# Patient Record
Sex: Male | Born: 1965 | Race: White | Hispanic: No | Marital: Married | Smoking: Never smoker
Health system: Southern US, Community
[De-identification: ages and names within clinical notes are randomized; demographics above are authoritative.]

## PROBLEM LIST (undated history)

## (undated) DIAGNOSIS — E7800 Pure hypercholesterolemia, unspecified: Secondary | ICD-10-CM

## (undated) DIAGNOSIS — M2550 Pain in unspecified joint: Secondary | ICD-10-CM

## (undated) DIAGNOSIS — R0602 Shortness of breath: Secondary | ICD-10-CM

## (undated) DIAGNOSIS — M549 Dorsalgia, unspecified: Secondary | ICD-10-CM

## (undated) DIAGNOSIS — G4730 Sleep apnea, unspecified: Secondary | ICD-10-CM

## (undated) DIAGNOSIS — J45909 Unspecified asthma, uncomplicated: Secondary | ICD-10-CM

## (undated) DIAGNOSIS — R600 Localized edema: Secondary | ICD-10-CM

## (undated) DIAGNOSIS — M25569 Pain in unspecified knee: Secondary | ICD-10-CM

## (undated) DIAGNOSIS — E669 Obesity, unspecified: Secondary | ICD-10-CM

## (undated) DIAGNOSIS — I10 Essential (primary) hypertension: Secondary | ICD-10-CM

## (undated) HISTORY — DX: Pain in unspecified joint: M25.50

## (undated) HISTORY — PX: TONSILLECTOMY: SUR1361

## (undated) HISTORY — DX: Obesity, unspecified: E66.9

## (undated) HISTORY — PX: WISDOM TOOTH EXTRACTION: SHX21

## (undated) HISTORY — PX: BILATERAL CARPAL TUNNEL RELEASE: SHX6508

## (undated) HISTORY — DX: Dorsalgia, unspecified: M54.9

## (undated) HISTORY — DX: Pure hypercholesterolemia, unspecified: E78.00

## (undated) HISTORY — DX: Shortness of breath: R06.02

## (undated) HISTORY — DX: Unspecified asthma, uncomplicated: J45.909

## (undated) HISTORY — DX: Localized edema: R60.0

## (undated) HISTORY — DX: Essential (primary) hypertension: I10

## (undated) HISTORY — DX: Sleep apnea, unspecified: G47.30

## (undated) HISTORY — DX: Pain in unspecified knee: M25.569

---

## 2009-06-23 ENCOUNTER — Encounter: Admission: RE | Admit: 2009-06-23 | Discharge: 2009-06-23 | Payer: Self-pay | Admitting: Orthopedic Surgery

## 2015-09-23 DIAGNOSIS — M47812 Spondylosis without myelopathy or radiculopathy, cervical region: Secondary | ICD-10-CM | POA: Insufficient documentation

## 2019-09-04 ENCOUNTER — Ambulatory Visit: Payer: Self-pay | Admitting: Orthopedic Surgery

## 2020-04-27 ENCOUNTER — Other Ambulatory Visit: Payer: Self-pay

## 2020-04-27 ENCOUNTER — Ambulatory Visit (INDEPENDENT_AMBULATORY_CARE_PROVIDER_SITE_OTHER): Payer: No Typology Code available for payment source | Admitting: Bariatrics

## 2020-04-27 ENCOUNTER — Encounter (INDEPENDENT_AMBULATORY_CARE_PROVIDER_SITE_OTHER): Payer: Self-pay | Admitting: Bariatrics

## 2020-04-27 VITALS — BP 136/92 | HR 82 | Temp 98.5°F | Ht 73.0 in | Wt 360.0 lb

## 2020-04-27 DIAGNOSIS — I10 Essential (primary) hypertension: Secondary | ICD-10-CM | POA: Insufficient documentation

## 2020-04-27 DIAGNOSIS — Z9189 Other specified personal risk factors, not elsewhere classified: Secondary | ICD-10-CM | POA: Diagnosis not present

## 2020-04-27 DIAGNOSIS — Z1331 Encounter for screening for depression: Secondary | ICD-10-CM

## 2020-04-27 DIAGNOSIS — M25569 Pain in unspecified knee: Secondary | ICD-10-CM

## 2020-04-27 DIAGNOSIS — G4733 Obstructive sleep apnea (adult) (pediatric): Secondary | ICD-10-CM

## 2020-04-27 DIAGNOSIS — R0602 Shortness of breath: Secondary | ICD-10-CM | POA: Diagnosis not present

## 2020-04-27 DIAGNOSIS — R5383 Other fatigue: Secondary | ICD-10-CM | POA: Diagnosis not present

## 2020-04-27 DIAGNOSIS — G4730 Sleep apnea, unspecified: Secondary | ICD-10-CM | POA: Insufficient documentation

## 2020-04-27 DIAGNOSIS — G4739 Other sleep apnea: Secondary | ICD-10-CM | POA: Diagnosis not present

## 2020-04-27 DIAGNOSIS — Z0289 Encounter for other administrative examinations: Secondary | ICD-10-CM

## 2020-04-27 DIAGNOSIS — Z6841 Body Mass Index (BMI) 40.0 and over, adult: Secondary | ICD-10-CM

## 2020-04-27 NOTE — Progress Notes (Signed)
Dear Peri Maris, FNP,   Thank you for referring Joe Huffman to our clinic. The following note includes my evaluation and treatment recommendations.  Chief Complaint:   OBESITY Joe Huffman (MR# [id number]) is a 54 y.o. male who presents for evaluation and treatment of obesity and related comorbidities. Current BMI is Body mass index is 47.5 kg/m.Marland Kitchen Joe Huffman has been struggling with his weight for many years and has been unsuccessful in either losing weight, maintaining weight loss, or reaching his healthy weight goal.  Joe Huffman is currently in the action stage of change and ready to dedicate time achieving and maintaining a healthier weight. Joe Huffman is interested in becoming our patient and working on intensive lifestyle modifications including (but not limited to) diet and exercise for weight loss.  Joe Huffman does like to cook, but notes difficulties finding meals that they enjoy. He craves carbohydrates and sauces. He states that he is a picky eater.  Joe Huffman's habits were reviewed today and are as follows: His family eats meals together, he thinks his family will eat healthier with him, his desired weight loss is 135 lbs, he has been heavy most of his life, he started gaining weight in 2000, his heaviest weight ever was 365 pounds, he is a picky eater and doesn't like to eat healthier foods, he craves self-baked bread, gyros, french fries, filet steaks, and sauces with mushrooms, he snacks frequently in the evenings, he skips lunch sometimes, he is frequently drinking liquids with calories, he frequently makes poor food choices and he struggles with emotional eating.  Depression Screen Joe Huffman's Food and Mood (modified PHQ-9) score was 9.  Depression screen PHQ 2/9 04/27/2020  Decreased Interest 1  Down, Depressed, Hopeless 1  PHQ - 2 Score 2  Altered sleeping 3  Tired, decreased energy 2  Change in appetite 1  Feeling bad or failure about yourself  0  Trouble  concentrating 0  Moving slowly or fidgety/restless 1  Suicidal thoughts 0  PHQ-9 Score 9  Difficult doing work/chores Not difficult at all   Subjective:   Other fatigue. Joe Huffman denies daytime somnolence and admits to waking up still tired. Joe Huffman generally gets 5 hours of sleep per night, and states that he does not sleep well most nights. Snoring is present. Apneic episodes are not present. Epworth Sleepiness Score is 4.  SOB (shortness of breath). Joe Huffman notes increasing shortness of breath with certain activities and seems to be worsening over time with weight gain. He notes getting out of breath sooner with activity than he used to. This has gotten worse recently. Joe Huffman denies shortness of breath at rest or orthopnea.  Knee pain, unspecified chronicity, unspecified laterality. Joe Huffman notes intermittent knee pain.  Other sleep apnea. Joe Huffman wears CPAP, but notes difficulty with his fit.  Essential hypertension. Joe Huffman is taking Bystolic.  BP Readings from Last 3 Encounters:  04/27/20 (!) 136/92   No results found for: CREATININE  Depression screening. Joe Huffman has a mildly positive depression screen with a PHQ-9 score of 9.  At risk for activity intolerance. Joe Huffman is at risk of exercise intolerance due to knee pain.  Assessment/Plan:   Other fatigue. Joe Huffman does feel that his weight is causing his energy to be lower than it should be. Fatigue may be related to obesity, depression or many other causes. Labs will be ordered, and in the meanwhile, Joe Huffman will focus on self care including making healthy food choices, increasing physical activity and focusing on stress reduction. EKG 12-Lead, Hemoglobin A1c, Insulin, random,  VITAMIN D 25 Hydroxy (Vit-D Deficiency, Fractures), T4, free, T3, TSH testing ordered today.  SOB (shortness of breath). [name] does feel that he gets out of breath more easily that he used to when he exercises. Joe Huffman's  shortness of breath appears to be obesity related and exercise induced. He has agreed to work on weight loss and gradually increase exercise to treat his exercise induced shortness of breath. Will continue to monitor closely. Lipid Panel With LDL/HDL Ratio ordered today.  Knee pain, unspecified chronicity, unspecified laterality. Joe Huffman will work on weight loss and will gradually increase his exercise.  Other sleep apnea. Intensive lifestyle modifications are the first line treatment for this issue. We discussed several lifestyle modifications today and he will continue to work on diet, exercise and weight loss efforts. We will continue to monitor. Orders and follow up as documented in patient record. Joe Huffman will continue CPAP use as directed.  Counseling  Sleep apnea is a condition in which breathing pauses or becomes shallow during sleep. This happens over and over during the night. This disrupts your sleep and keeps your body from getting the rest that it needs, which can cause tiredness and lack of energy (fatigue) during the day.  Sleep apnea treatment: If you were given a device to open your airway while you sleep, USE IT!  Sleep hygiene:   Limit or avoid alcohol, caffeinated beverages, and cigarettes, especially close to bedtime.   Do not eat a large meal or eat spicy foods right before bedtime. This can lead to digestive discomfort that can make it hard for you to sleep.  Keep a sleep diary to help you and your health care provider figure out what could be causing your insomnia.  . Make your bedroom a dark, comfortable place where it is easy to fall asleep. ? Put up shades or blackout curtains to block light from outside. ? Use a white noise machine to block noise. ? Keep the temperature cool. . Limit screen use before bedtime. This includes: ? Watching TV. ? Using your smartphone, tablet, or computer. . Stick to a routine that includes going to bed and waking up at the same  times every day and night. This can help you fall asleep faster. Consider making a quiet activity, such as reading, part of your nighttime routine. . Try to avoid taking naps during the day so that you sleep better at night. . Get out of bed if you are still awake after 15 minutes of trying to sleep. Keep the lights down, but try reading or doing a quiet activity. When you feel sleepy, go back to bed.  Essential hypertension. [name] is working on healthy weight loss and exercise to improve blood pressure control. We will watch for signs of hypotension as he continues his lifestyle modifications. He will continue his medication as directed. Comprehensive metabolic panel ordered today.  Depression screening. Cylan had a positive depression screening. Depression is commonly associated with obesity and often results in emotional eating behaviors. We will monitor this closely and work on CBT to help improve the non-hunger eating patterns. Referral to Psychology may be required if no improvement is seen as he continues in our clinic.  At risk for activity intolerance. Chukwuemeka was given approximately 15 minutes of exercise intolerance counseling today. He is 54 y.o. male and has risk factors exercise intolerance including obesity. We discussed intensive lifestyle modifications today with an emphasis on specific weight loss instructions and strategies. Dorrian will slowly increase activity as tolerated.  Repetitive spaced learning was employed today to elicit superior memory formation and behavioral change.  Class 3 severe obesity with serious comorbidity and body mass index (BMI) of 45.0 to 49.9 in adult, unspecified obesity type (HCC).  Elwood is currently in the action stage of change and his goal is to continue with weight loss efforts. I recommend Joss begin the structured treatment plan as follows:  He has agreed to the Category 3 Plan.  He will work on meal planning, intentional  eating, and will read labels.  Exercise goals: Blakely has a home gym, but is not using.   Behavioral modification strategies: increasing lean protein intake, decreasing simple carbohydrates, increasing vegetables, increasing water intake, decreasing eating out, no skipping meals, meal planning and cooking strategies, keeping healthy foods in the home and planning for success.  He was informed of the importance of frequent follow-up visits to maximize his success with intensive lifestyle modifications for his multiple health conditions. He was informed we would discuss his lab results at his next visit unless there is a critical issue that needs to be addressed sooner. Weber agreed to keep his next visit at the agreed upon time to discuss these results.  Objective:   Blood pressure (!) 136/92, pulse 82, temperature 98.5 F (36.9 C), height 6\' 1"  (1.854 m), weight (!) 360 lb (163.3 kg), SpO2 97 %. Body mass index is 47.5 kg/m.  EKG: Sinus  Rhythm with a rate of 78 BPM. RSR(V1) - nondiagnostic. Probably normal.  Indirect Calorimeter completed today shows a VO2 of 308 and a REE of 2145.  His calculated basal metabolic rate is 2146 thus his basal metabolic rate is worse than expected.  General: Cooperative, alert, well developed, in no acute distress. HEENT: Conjunctivae and lids unremarkable. Cardiovascular: Regular rhythm.  Lungs: Normal work of breathing. Neurologic: No focal deficits.   No results found for: CREATININE, BUN, NA, K, CL, CO2 No results found for: ALT, AST, GGT, ALKPHOS, BILITOT No results found for: HGBA1C No results found for: INSULIN No results found for: TSH No results found for: CHOL, HDL, LDLCALC, LDLDIRECT, TRIG, CHOLHDL No results found for: WBC, HGB, HCT, MCV, PLT No results found for: IRON, TIBC, FERRITIN  Attestation Statements:   Reviewed by clinician on day of visit: allergies, medications, problem list, medical history, surgical history, family  history, social history, and previous encounter notes.  [id number], am acting as Fernanda Drum for Energy manager, DO   I have reviewed the above documentation for accuracy and completeness, and I agree with the above. [employer], DO

## 2020-04-28 ENCOUNTER — Encounter (INDEPENDENT_AMBULATORY_CARE_PROVIDER_SITE_OTHER): Payer: Self-pay | Admitting: Bariatrics

## 2020-04-28 DIAGNOSIS — E786 Lipoprotein deficiency: Secondary | ICD-10-CM | POA: Insufficient documentation

## 2020-04-28 DIAGNOSIS — R7303 Prediabetes: Secondary | ICD-10-CM | POA: Insufficient documentation

## 2020-04-28 DIAGNOSIS — E559 Vitamin D deficiency, unspecified: Secondary | ICD-10-CM | POA: Insufficient documentation

## 2020-04-28 LAB — COMPREHENSIVE METABOLIC PANEL
ALT: 40 IU/L (ref 0–44)
AST: 29 IU/L (ref 0–40)
Albumin/Globulin Ratio: 1.8 (ref 1.2–2.2)
Albumin: 4.3 g/dL (ref 3.8–4.9)
Alkaline Phosphatase: 60 IU/L (ref 48–121)
BUN/Creatinine Ratio: 14 (ref 9–20)
BUN: 10 mg/dL (ref 6–24)
Bilirubin Total: 0.8 mg/dL (ref 0.0–1.2)
CO2: 19 mmol/L — ABNORMAL LOW (ref 20–29)
Calcium: 9.1 mg/dL (ref 8.7–10.2)
Chloride: 107 mmol/L — ABNORMAL HIGH (ref 96–106)
Creatinine, Ser: 0.7 mg/dL — ABNORMAL LOW (ref 0.76–1.27)
GFR calc Af Amer: 125 mL/min/{1.73_m2} (ref >59)
GFR calc non Af Amer: 109 mL/min/{1.73_m2} (ref >59)
Globulin, Total: 2.4 g/dL (ref 1.5–4.5)
Glucose: 85 mg/dL (ref 65–99)
Potassium: 4.1 mmol/L (ref 3.5–5.2)
Sodium: 142 mmol/L (ref 134–144)
Total Protein: 6.7 g/dL (ref 6.0–8.5)

## 2020-04-28 LAB — LIPID PANEL WITH LDL/HDL RATIO
Cholesterol, Total: 130 mg/dL (ref 100–199)
HDL: 32 mg/dL — ABNORMAL LOW (ref >39)
LDL Chol Calc (NIH): 72 mg/dL (ref 0–99)
LDL/HDL Ratio: 2.3 ratio (ref 0.0–3.6)
Triglycerides: 148 mg/dL (ref 0–149)
VLDL Cholesterol Cal: 26 mg/dL (ref 5–40)

## 2020-04-28 LAB — T4, FREE: Free T4: 1 ng/dL (ref 0.82–1.77)

## 2020-04-28 LAB — HEMOGLOBIN A1C
Est. average glucose Bld gHb Est-mCnc: 117 mg/dL
Hgb A1c MFr Bld: 5.7 % — ABNORMAL HIGH (ref 4.8–5.6)

## 2020-04-28 LAB — VITAMIN D 25 HYDROXY (VIT D DEFICIENCY, FRACTURES): Vit D, 25-Hydroxy: 23.5 ng/mL — ABNORMAL LOW (ref 30.0–100.0)

## 2020-04-28 LAB — TSH: TSH: 1.97 u[IU]/mL (ref 0.450–4.500)

## 2020-04-28 LAB — INSULIN, RANDOM: INSULIN: 41.5 u[IU]/mL — ABNORMAL HIGH (ref 2.6–24.9)

## 2020-04-28 LAB — T3: T3, Total: 171 ng/dL (ref 71–180)

## 2020-05-11 ENCOUNTER — Encounter (INDEPENDENT_AMBULATORY_CARE_PROVIDER_SITE_OTHER): Payer: Self-pay | Admitting: Bariatrics

## 2020-05-11 ENCOUNTER — Ambulatory Visit (INDEPENDENT_AMBULATORY_CARE_PROVIDER_SITE_OTHER): Payer: No Typology Code available for payment source | Admitting: Bariatrics

## 2020-05-11 ENCOUNTER — Other Ambulatory Visit: Payer: Self-pay

## 2020-05-11 VITALS — BP 133/83 | HR 69 | Temp 98.5°F | Ht 73.0 in | Wt 361.0 lb

## 2020-05-11 DIAGNOSIS — R7303 Prediabetes: Secondary | ICD-10-CM | POA: Diagnosis not present

## 2020-05-11 DIAGNOSIS — Z9189 Other specified personal risk factors, not elsewhere classified: Secondary | ICD-10-CM | POA: Diagnosis not present

## 2020-05-11 DIAGNOSIS — E559 Vitamin D deficiency, unspecified: Secondary | ICD-10-CM

## 2020-05-11 DIAGNOSIS — Z6841 Body Mass Index (BMI) 40.0 and over, adult: Secondary | ICD-10-CM

## 2020-05-11 DIAGNOSIS — E786 Lipoprotein deficiency: Secondary | ICD-10-CM | POA: Diagnosis not present

## 2020-05-11 MED ORDER — VITAMIN D (ERGOCALCIFEROL) 1.25 MG (50000 UNIT) PO CAPS: 50000.0000 [IU] | ORAL_CAPSULE | ORAL | 0 refills | Status: DC

## 2020-05-12 ENCOUNTER — Encounter (INDEPENDENT_AMBULATORY_CARE_PROVIDER_SITE_OTHER): Payer: Self-pay | Admitting: Bariatrics

## 2020-05-12 NOTE — Progress Notes (Signed)
Chief Complaint:   OBESITY Joe Huffman is here to discuss his progress with his obesity treatment plan along with follow-up of his obesity related diagnoses. Joe Huffman is on the Category 3 Plan and states he is following his eating plan approximately 10% of the time. Joe Huffman states he is exercising 0 minutes 0 times per week.  Today's visit was #: 2 Starting weight: 360 lbs Starting date: 04/27/2020 Today's weight: 361 lbs Today's date: 05/11/2020 Total lbs lost to date: 0 Total lbs lost since last in-office visit: 0  Interim History: Lary's weight is up 1 lb. He has not started his diet.  Subjective:   Vitamin D deficiency. Keyen is taking Vitamin D 1,000 IU and a multivitamin.   Ref. Range 04/27/2020 12:41  Vitamin D, 25-Hydroxy Latest Ref Range: 30.0 - 100.0 ng/mL 23.5 (L)   Pre-diabetes. Joe Huffman has a diagnosis of prediabetes based on his elevated HgA1c and was informed this puts him at greater risk of developing diabetes. He continues to work on diet and exercise to decrease his risk of diabetes. He denies nausea or hypoglycemia. Appetite is normal.  Lab Results  Component Value Date   HGBA1C 5.7 (H) 04/27/2020   Lab Results  Component Value Date   INSULIN 41.5 (H) 04/27/2020   Low HDL (under 40). HDL level low at 32 on 04/27/2020; otherwise, lipid panel normal.  At risk for diabetes mellitus. Joe Huffman is at higher than average risk for developing diabetes due to prediabetes.  Assessment/Plan:   Vitamin D deficiency. Low Vitamin D level contributes to fatigue and are associated with obesity, breast, and colon cancer. He was given a prescription for Vitamin D, Ergocalciferol, (DRISDOL) 1.25 MG (50000 UNIT) CAPS capsule every week #4 with 0 refills and will follow-up for routine testing of Vitamin D, at least 2-3 times per year to avoid over-replacement.   Pre-diabetes. Joe Huffman will continue to work on weight loss, exercise, increasing healthy  fats and protein, and decreasing simple carbohydrates to help decrease the risk of diabetes. Handout was provided on Prediabetes.  Low HDL (under 40). Joe Huffman will decrease carbohydrates and increase exercise.  At risk for diabetes mellitus. Joe Huffman was given approximately 15 minutes of diabetes education and counseling today. We discussed intensive lifestyle modifications today with an emphasis on weight loss as well as increasing exercise and decreasing simple carbohydrates in his diet. We also reviewed medication options with an emphasis on risk versus benefit of those discussed.   Repetitive spaced learning was employed today to elicit superior memory formation and behavioral change.  Class 3 severe obesity with serious comorbidity and body mass index (BMI) of 45.0 to 49.9 in adult, unspecified obesity type (HCC).  Joe Huffman is currently in the action stage of change. As such, his goal is to continue with weight loss efforts. He has agreed to the Category 3 Plan.   He will work on meal planning and intentional eating.   We reviewed with the patient labs from 04/27/2020 including CMP, lipids, Vitamin D, A1c, insulin, and thyroid panel.  Exercise goals: All adults should avoid inactivity. Some physical activity is better than none, and adults who participate in any amount of physical activity gain some health benefits.  Behavioral modification strategies: increasing lean protein intake, decreasing simple carbohydrates, increasing vegetables, increasing water intake, decreasing eating out, no skipping meals, meal planning and cooking strategies, keeping healthy foods in the home and planning for success.  Joe Huffman has agreed to follow-up with our clinic in 2 weeks. He  was informed of the importance of frequent follow-up visits to maximize his success with intensive lifestyle modifications for his multiple health conditions.   Objective:   Blood pressure (!) 133/83, pulse 69, temperature  98.5 F (36.9 C), height 6\' 1"  (1.854 m), weight (!) 361 lb (163.7 kg), SpO2 97 %. Body mass index is 47.63 kg/m.  General: Cooperative, alert, well developed, in no acute distress. HEENT: Conjunctivae and lids unremarkable. Cardiovascular: Regular rhythm.  Lungs: Normal work of breathing. Neurologic: No focal deficits.   Lab Results  Component Value Date   CREATININE 0.70 (L) 04/27/2020   BUN 10 04/27/2020   NA 142 04/27/2020   K 4.1 04/27/2020   CL 107 (H) 04/27/2020   CO2 19 (L) 04/27/2020   Lab Results  Component Value Date   ALT 40 04/27/2020   AST 29 04/27/2020   ALKPHOS 60 04/27/2020   BILITOT 0.8 04/27/2020   Lab Results  Component Value Date   HGBA1C 5.7 (H) 04/27/2020   Lab Results  Component Value Date   INSULIN 41.5 (H) 04/27/2020   Lab Results  Component Value Date   TSH 1.970 04/27/2020   Lab Results  Component Value Date   CHOL 130 04/27/2020   HDL 32 (L) 04/27/2020   LDLCALC 72 04/27/2020   TRIG 148 04/27/2020   No results found for: WBC, HGB, HCT, MCV, PLT No results found for: IRON, TIBC, FERRITIN  Attestation Statements:   Reviewed by clinician on day of visit: allergies, medications, problem list, medical history, surgical history, family history, social history, and previous encounter notes.  06/28/2020, am acting as Fernanda Drum for Energy manager, DO   I have reviewed the above documentation for accuracy and completeness, and I agree with the above. [employer], DO

## 2020-05-25 ENCOUNTER — Encounter (INDEPENDENT_AMBULATORY_CARE_PROVIDER_SITE_OTHER): Payer: Self-pay | Admitting: Physician Assistant

## 2020-05-25 ENCOUNTER — Ambulatory Visit (INDEPENDENT_AMBULATORY_CARE_PROVIDER_SITE_OTHER): Payer: No Typology Code available for payment source | Admitting: Physician Assistant

## 2020-05-25 ENCOUNTER — Other Ambulatory Visit: Payer: Self-pay

## 2020-05-25 VITALS — BP 145/75 | HR 71 | Temp 98.3°F | Ht 73.0 in | Wt 352.0 lb

## 2020-05-25 DIAGNOSIS — Z6841 Body Mass Index (BMI) 40.0 and over, adult: Secondary | ICD-10-CM

## 2020-05-25 DIAGNOSIS — Z9189 Other specified personal risk factors, not elsewhere classified: Secondary | ICD-10-CM | POA: Diagnosis not present

## 2020-05-25 DIAGNOSIS — E559 Vitamin D deficiency, unspecified: Secondary | ICD-10-CM | POA: Diagnosis not present

## 2020-05-25 DIAGNOSIS — I10 Essential (primary) hypertension: Secondary | ICD-10-CM

## 2020-05-26 MED ORDER — NEBIVOLOL HCL 5 MG PO TABS: 5.0000 mg | ORAL_TABLET | Freq: Every day | ORAL | 0 refills | Status: DC

## 2020-05-26 NOTE — Progress Notes (Signed)
Chief Complaint:   OBESITY Joe Huffman is here to discuss his progress with his obesity treatment plan along with follow-up of his obesity related diagnoses. Joe Huffman is on the Category 3 Plan and states he is following his eating plan approximately 50% of the time. [name] states he is exercising 0 minutes 0 times per week.  Today's visit was #: 3 Starting weight: 360 lbs Starting date: 04/27/2020 Today's weight: 352 lbs Today's date: 05/25/2020 Total lbs lost to date: 8 Total lbs lost since last in-office visit: 9  Interim History: Joe states that since returning from [place], he was able to eat on plan. He states he is not weighing his protein.  Subjective:   Essential hypertension.  [place] is on Bystolic. No  chest pain or headache.   BP Readings from Last 3 Encounters:  05/25/20 (!) 145/75  05/11/20 (!) 133/83  04/27/20 (!) 136/92   Lab Results  Component Value Date   CREATININE 0.70 (L) 04/27/2020   Vitamin D deficiency. Decorey is on Vitamin D supplementation. No nausea, vomiting, or muscle weakness.    Ref. Range 04/27/2020 12:41  Vitamin D, 25-Hydroxy Latest Ref Range: 30.0 - 100.0 ng/mL 23.5 (L)   At risk for heart disease. Joe Huffman is at a higher than average risk for cardiovascular disease due to obesity.   Assessment/Plan:   Essential hypertension. Joe Huffman is working on healthy weight loss and exercise to improve blood pressure control. We will watch for signs of hypotension as he continues his lifestyle modifications. Refill was given for nebivolol (BYSTOLIC) 5 MG tablet #30 with 0 refills.  Vitamin D deficiency. Low Vitamin D level contributes to fatigue and are associated with obesity, breast, and colon cancer. He agrees to continue to take Vitamin D as directed and will follow-up for routine testing of Vitamin D, at least 2-3 times per year to avoid over-replacement.  At risk for heart disease. Joe Huffman was given approximately 15  minutes of coronary artery disease prevention counseling today. He is 54 y.o. male and has risk factors for heart disease including obesity. We discussed intensive lifestyle modifications today with an emphasis on specific weight loss instructions and strategies.   Repetitive spaced learning was employed today to elicit superior memory formation and behavioral change.  Class 3 severe obesity with serious comorbidity and body mass index (BMI) of 45.0 to 49.9 in adult, unspecified obesity type (HCC).  Joe Huffman is currently in the action stage of change. As such, his goal is to continue with weight loss efforts. He has agreed to the Category 3 Plan.   GLP-1 was discussed as a possibility for appetite control if needed.  Exercise goals: For substantial health benefits, adults should do at least 150 minutes (2 hours and 30 minutes) a week of moderate-intensity, or 75 minutes (1 hour and 15 minutes) a week of vigorous-intensity aerobic physical activity, or an equivalent combination of moderate- and vigorous-intensity aerobic activity. Aerobic activity should be performed in episodes of at least 10 minutes, and preferably, it should be spread throughout the week.  Behavioral modification strategies: increasing lean protein intake and meal planning and cooking strategies.  Joe Huffman has agreed to follow-up with our clinic in 2 weeks. He was informed of the importance of frequent follow-up visits to maximize his success with intensive lifestyle modifications for his multiple health conditions.   Objective:   Blood pressure (!) 145/75, pulse 71, temperature 98.3 F (36.8 C), temperature source Oral, height 6\' 1"  (1.854 m), weight (!) 352 lb (  159.7 kg), SpO2 96 %. Body mass index is 46.44 kg/m.  General: Cooperative, alert, well developed, in no acute distress. HEENT: Conjunctivae and lids unremarkable. Cardiovascular: Regular rhythm.  Lungs: Normal work of breathing. Neurologic: No focal deficits.    Lab Results  Component Value Date   CREATININE 0.70 (L) 04/27/2020   BUN 10 04/27/2020   NA 142 04/27/2020   K 4.1 04/27/2020   CL 107 (H) 04/27/2020   CO2 19 (L) 04/27/2020   Lab Results  Component Value Date   ALT 40 04/27/2020   AST 29 04/27/2020   ALKPHOS 60 04/27/2020   BILITOT 0.8 04/27/2020   Lab Results  Component Value Date   HGBA1C 5.7 (H) 04/27/2020   Lab Results  Component Value Date   INSULIN 41.5 (H) 04/27/2020   Lab Results  Component Value Date   TSH 1.970 04/27/2020   Lab Results  Component Value Date   CHOL 130 04/27/2020   HDL 32 (L) 04/27/2020   LDLCALC 72 04/27/2020   TRIG 148 04/27/2020   No results found for: WBC, HGB, HCT, MCV, PLT No results found for: IRON, TIBC, FERRITIN  Attestation Statements:   Reviewed by clinician on day of visit: allergies, medications, problem list, medical history, surgical history, family history, social history, and previous encounter notes.  IMarianna Payment, am acting as transcriptionist for Alois Cliche, PA-C   I have reviewed the above documentation for accuracy and completeness, and I agree with the above. Alois Cliche, PA-C

## 2020-06-09 ENCOUNTER — Ambulatory Visit (INDEPENDENT_AMBULATORY_CARE_PROVIDER_SITE_OTHER): Payer: No Typology Code available for payment source | Admitting: Bariatrics

## 2020-06-09 ENCOUNTER — Other Ambulatory Visit: Payer: Self-pay

## 2020-06-09 ENCOUNTER — Encounter (INDEPENDENT_AMBULATORY_CARE_PROVIDER_SITE_OTHER): Payer: Self-pay | Admitting: Bariatrics

## 2020-06-09 VITALS — BP 141/78 | Temp 98.8°F | Ht 73.0 in | Wt 349.0 lb

## 2020-06-09 DIAGNOSIS — G4739 Other sleep apnea: Secondary | ICD-10-CM

## 2020-06-09 DIAGNOSIS — I10 Essential (primary) hypertension: Secondary | ICD-10-CM

## 2020-06-09 DIAGNOSIS — R7303 Prediabetes: Secondary | ICD-10-CM

## 2020-06-09 DIAGNOSIS — Z6841 Body Mass Index (BMI) 40.0 and over, adult: Secondary | ICD-10-CM

## 2020-06-10 ENCOUNTER — Encounter (INDEPENDENT_AMBULATORY_CARE_PROVIDER_SITE_OTHER): Payer: Self-pay | Admitting: Bariatrics

## 2020-06-10 NOTE — Progress Notes (Signed)
Chief Complaint:   OBESITY Joe Huffman is here to discuss his progress with his obesity treatment plan along with follow-up of his obesity related diagnoses. Joe Huffman is on the Category 3 Plan and states he is following his eating plan approximately 80% of the time. Joe Huffman states he is exercising 0 minutes 0 times per week.  Today's visit was #: 4 Starting weight: 360 lbs Starting date: 04/27/2020 Today's weight: 349 lbs Today's date: 06/09/2020 Total lbs lost to date: 11 Total lbs lost since last in-office visit: 3  Interim History: Joe Huffman is down an additional 3 lbs since his last visit. He has been more reasonable and adhering more to the diet.  Subjective:   Essential hypertension. Cutter is taking Bystolic. Blood pressure is reasonably well controlled.  BP Readings from Last 3 Encounters:  06/09/20 (!) 141/78  05/25/20 (!) 145/75  05/11/20 (!) 133/83   Lab Results  Component Value Date   CREATININE 0.70 (L) 04/27/2020   Other sleep apnea. Leocadio is using CPAP, but reports difficulty with the fit.  Prediabetes. Joe Huffman has a diagnosis of prediabetes based on his elevated HgA1c and was informed this puts him at greater risk of developing diabetes. He continues to work on diet and exercise to decrease his risk of diabetes. He denies nausea or hypoglycemia.  Lab Results  Component Value Date   HGBA1C 5.7 (H) 04/27/2020   Lab Results  Component Value Date   INSULIN 41.5 (H) 04/27/2020   Assessment/Plan:   Essential hypertension. Joe Huffman is working on healthy weight loss and exercise to improve blood pressure control. We will watch for signs of hypotension as he continues his lifestyle modifications. He will continue his medication as directed.   Other sleep apnea. Intensive lifestyle modifications are the first line treatment for this issue. We discussed several lifestyle modifications today and he will continue to work on diet, exercise and  weight loss efforts. We will continue to monitor. Orders and follow up as documented in patient record. [name] will continue to wear CPAP as directed.   Counseling  Sleep apnea is a condition in which breathing pauses or becomes shallow during sleep. This happens over and over during the night. This disrupts your sleep and keeps your body from getting the rest that it needs, which can cause tiredness and lack of energy (fatigue) during the day.  Sleep apnea treatment: If you were given a device to open your airway while you sleep, USE IT!  Sleep hygiene:   Limit or avoid alcohol, caffeinated beverages, and cigarettes, especially close to bedtime.   Do not eat a large meal or eat spicy foods right before bedtime. This can lead to digestive discomfort that can make it hard for you to sleep.  Keep a sleep diary to help you and your health care provider figure out what could be causing your insomnia.  . Make your bedroom a dark, comfortable place where it is easy to fall asleep. ? Put up shades or blackout curtains to block light from outside. ? Use a white noise machine to block noise. ? Keep the temperature cool. . Limit screen use before bedtime. This includes: ? Watching TV. ? Using your smartphone, tablet, or computer. . Stick to a routine that includes going to bed and waking up at the same times every day and night. This can help you fall asleep faster. Consider making a quiet activity, such as reading, part of your nighttime routine. . Try to avoid taking naps  during the day so that you sleep better at night. . Get out of bed if you are still awake after 15 minutes of trying to sleep. Keep the lights down, but try reading or doing a quiet activity. When you feel sleepy, go back to bed.  Prediabetes. Joe Huffman will continue to work on weight loss, exercise, increasing healthy fats and protein, and decreasing simple carbohydrates to help decrease the risk of diabetes.   Class 3  severe obesity with serious comorbidity and body mass index (BMI) of 45.0 to 49.9 in adult, unspecified obesity type (HCC).  Joe Huffman is currently in the action stage of change. As such, his goal is to continue with weight loss efforts. He has agreed to the Category 3 Plan.   He will work on meal planning, intentional eating, and will continue to decrease his quantities.  Handouts were provided on [employer].  Exercise goals: Joe Huffman will exercise on the stationary bike and will ease back into exercise.  Behavioral modification strategies: increasing lean protein intake, decreasing simple carbohydrates, increasing vegetables, increasing water intake, decreasing eating out, no skipping meals, meal planning and cooking strategies, keeping healthy foods in the home and planning for success.  Joe Huffman has agreed to follow-up with our clinic in 2-3 weeks. He was informed of the importance of frequent follow-up visits to maximize his success with intensive lifestyle modifications for his multiple health conditions.   Objective:   Blood pressure (!) 141/78, temperature 98.8 F (37.1 C), temperature source Oral, height 6\' 1"  (1.854 m), weight (!) 349 lb (158.3 kg). Body mass index is 46.04 kg/m.  General: Cooperative, alert, well developed, in no acute distress. HEENT: Conjunctivae and lids unremarkable. Cardiovascular: Regular rhythm.  Lungs: Normal work of breathing. Neurologic: No focal deficits.   Lab Results  Component Value Date   CREATININE 0.70 (L) 04/27/2020   BUN 10 04/27/2020   NA 142 04/27/2020   K 4.1 04/27/2020   CL 107 (H) 04/27/2020   CO2 19 (L) 04/27/2020   Lab Results  Component Value Date   ALT 40 04/27/2020   AST 29 04/27/2020   ALKPHOS 60 04/27/2020   BILITOT 0.8 04/27/2020   Lab Results  Component Value Date   HGBA1C 5.7 (H) 04/27/2020   Lab Results  Component Value Date   INSULIN 41.5 (H) 04/27/2020   Lab Results  Component Value  Date   TSH 1.970 04/27/2020   Lab Results  Component Value Date   CHOL 130 04/27/2020   HDL 32 (L) 04/27/2020   LDLCALC 72 04/27/2020   TRIG 148 04/27/2020   No results found for: WBC, HGB, HCT, MCV, PLT No results found for: IRON, TIBC, FERRITIN  Attestation Statements:   Reviewed by clinician on day of visit: allergies, medications, problem list, medical history, surgical history, family history, social history, and previous encounter notes.  Time spent on visit including pre-visit chart review and post-visit charting and care was 20 minutes.   06/28/2020, am acting as Fernanda Drum for Energy manager, DO   I have reviewed the above documentation for accuracy and completeness, and I agree with the above. [employer], DO

## 2020-06-12 ENCOUNTER — Other Ambulatory Visit (INDEPENDENT_AMBULATORY_CARE_PROVIDER_SITE_OTHER): Payer: Self-pay | Admitting: Bariatrics

## 2020-06-12 DIAGNOSIS — E559 Vitamin D deficiency, unspecified: Secondary | ICD-10-CM

## 2020-06-16 ENCOUNTER — Encounter (INDEPENDENT_AMBULATORY_CARE_PROVIDER_SITE_OTHER): Payer: Self-pay

## 2020-06-16 NOTE — Telephone Encounter (Signed)
Message sent to pt-CS 

## 2020-06-29 ENCOUNTER — Encounter (INDEPENDENT_AMBULATORY_CARE_PROVIDER_SITE_OTHER): Payer: Self-pay | Admitting: Bariatrics

## 2020-06-30 NOTE — Telephone Encounter (Signed)
Please review

## 2020-07-02 ENCOUNTER — Ambulatory Visit (INDEPENDENT_AMBULATORY_CARE_PROVIDER_SITE_OTHER): Payer: No Typology Code available for payment source | Admitting: Bariatrics

## 2020-07-02 ENCOUNTER — Encounter (INDEPENDENT_AMBULATORY_CARE_PROVIDER_SITE_OTHER): Payer: Self-pay | Admitting: Bariatrics

## 2020-07-02 ENCOUNTER — Other Ambulatory Visit: Payer: Self-pay

## 2020-07-02 VITALS — BP 125/79 | HR 80 | Temp 98.1°F | Ht 73.0 in | Wt 340.0 lb

## 2020-07-02 DIAGNOSIS — Z6841 Body Mass Index (BMI) 40.0 and over, adult: Secondary | ICD-10-CM

## 2020-07-02 DIAGNOSIS — R7303 Prediabetes: Secondary | ICD-10-CM | POA: Diagnosis not present

## 2020-07-02 DIAGNOSIS — E559 Vitamin D deficiency, unspecified: Secondary | ICD-10-CM

## 2020-07-02 MED ORDER — VITAMIN D (ERGOCALCIFEROL) 1.25 MG (50000 UNIT) PO CAPS: 50000.0000 [IU] | ORAL_CAPSULE | ORAL | 0 refills | Status: DC

## 2020-07-07 ENCOUNTER — Encounter (INDEPENDENT_AMBULATORY_CARE_PROVIDER_SITE_OTHER): Payer: Self-pay | Admitting: Bariatrics

## 2020-07-07 NOTE — Progress Notes (Signed)
Chief Complaint:   OBESITY Joe Huffman is here to discuss his progress with his obesity treatment plan along with follow-up of his obesity related diagnoses. Joe Huffman is on the Category 3 Plan and states he is following his eating plan approximately 80+% of the time. Joe Huffman states he is exercising 0 minutes 0 times per week.  Today's visit was #: 5 Starting weight: 360 lbs Starting date: 04/27/2020 Today's weight: 340 lbs Today's date: 07/02/2020 Total lbs lost to date: 20 Total lbs lost since last in-office visit: 9  Interim History: Joe Huffman is down an additional 9 lbs and doing well with diet. His wife is present with him today. He reports making smart choices.  Subjective:   Vitamin D deficiency. No nausea, vomiting, or muscle weakness.    Ref. Range 04/27/2020 12:41  Vitamin D, 25-Hydroxy Latest Ref Range: 30.0 - 100.0 ng/mL 23.5 (L)   Pre-diabetes. Joe Huffman has a diagnosis of prediabetes based on his elevated HgA1c and was informed this puts him at greater risk of developing diabetes. He continues to work on diet and exercise to decrease his risk of diabetes. He denies nausea or hypoglycemia. No polyphagia.  Lab Results  Component Value Date   HGBA1C 5.7 (H) 04/27/2020   Lab Results  Component Value Date   INSULIN 41.5 (H) 04/27/2020   Assessment/Plan:   Vitamin D deficiency. Low Vitamin D level contributes to fatigue and are associated with obesity, breast, and colon cancer. He was given a prescription for Vitamin D, Ergocalciferol, (DRISDOL) 1.25 MG (50000 UNIT) CAPS capsule every week #4 with 0 refills and will follow-up for routine testing of Vitamin D, at least 2-3 times per year to avoid over-replacement.   Pre-diabetes. Joe Huffman will continue to work on weight loss, exercise, and decreasing simple carbohydrates to help decrease the risk of diabetes. We discussed bread making and ways to decrease calories.  Class 3 severe obesity with serious  comorbidity and body mass index (BMI) of 40.0 to 44.9 in adult, unspecified obesity type (HCC).  Joe Huffman is currently in the action stage of change. As such, his goal is to continue with weight loss efforts. He has agreed to the Category 3 Plan.   He will make smart choices while on vacation.  Exercise goals: Joe Huffman will walk more for exercise.   Behavioral modification strategies: increasing lean protein intake, decreasing simple carbohydrates, increasing vegetables, increasing water intake, decreasing eating out, no skipping meals, meal planning and cooking strategies, keeping healthy foods in the home and planning for success.  Joe Huffman has agreed to follow-up with our clinic in 2 weeks (week of October 11). He was informed of the importance of frequent follow-up visits to maximize his success with intensive lifestyle modifications for his multiple health conditions.   Objective:   Blood pressure 125/79, pulse 80, temperature 98.1 F (36.7 C), height 6\' 1"  (1.854 m), weight (!) 340 lb (154.2 kg), SpO2 97 %. Body mass index is 44.86 kg/m.  General: Cooperative, alert, well developed, in no acute distress. HEENT: Conjunctivae and lids unremarkable. Cardiovascular: Regular rhythm.  Lungs: Normal work of breathing. Neurologic: No focal deficits.   Lab Results  Component Value Date   CREATININE 0.70 (L) 04/27/2020   BUN 10 04/27/2020   NA 142 04/27/2020   K 4.1 04/27/2020   CL 107 (H) 04/27/2020   CO2 19 (L) 04/27/2020   Lab Results  Component Value Date   ALT 40 04/27/2020   AST 29 04/27/2020   ALKPHOS 60 04/27/2020  BILITOT 0.8 04/27/2020   Lab Results  Component Value Date   HGBA1C 5.7 (H) 04/27/2020   Lab Results  Component Value Date   INSULIN 41.5 (H) 04/27/2020   Lab Results  Component Value Date   TSH 1.970 04/27/2020   Lab Results  Component Value Date   CHOL 130 04/27/2020   HDL 32 (L) 04/27/2020   LDLCALC 72 04/27/2020   TRIG 148 04/27/2020    No results found for: WBC, HGB, HCT, MCV, PLT No results found for: IRON, TIBC, FERRITIN  Attestation Statements:   Reviewed by clinician on day of visit: allergies, medications, problem list, medical history, surgical history, family history, social history, and previous encounter notes.  Fernanda Drum, am acting as Energy manager for [employer], DO   I have reviewed the above documentation for accuracy and completeness, and I agree with the above. Corinna Capra, DO

## 2020-07-27 ENCOUNTER — Other Ambulatory Visit: Payer: Self-pay

## 2020-07-27 ENCOUNTER — Other Ambulatory Visit (INDEPENDENT_AMBULATORY_CARE_PROVIDER_SITE_OTHER): Payer: Self-pay | Admitting: Bariatrics

## 2020-07-27 ENCOUNTER — Ambulatory Visit (INDEPENDENT_AMBULATORY_CARE_PROVIDER_SITE_OTHER): Payer: No Typology Code available for payment source | Admitting: Bariatrics

## 2020-07-27 ENCOUNTER — Encounter (INDEPENDENT_AMBULATORY_CARE_PROVIDER_SITE_OTHER): Payer: Self-pay | Admitting: Bariatrics

## 2020-07-27 VITALS — BP 144/73 | HR 70 | Temp 98.3°F | Ht 73.0 in | Wt 338.0 lb

## 2020-07-27 DIAGNOSIS — E559 Vitamin D deficiency, unspecified: Secondary | ICD-10-CM

## 2020-07-27 DIAGNOSIS — I10 Essential (primary) hypertension: Secondary | ICD-10-CM

## 2020-07-27 DIAGNOSIS — Z9189 Other specified personal risk factors, not elsewhere classified: Secondary | ICD-10-CM

## 2020-07-27 DIAGNOSIS — Z6841 Body Mass Index (BMI) 40.0 and over, adult: Secondary | ICD-10-CM | POA: Diagnosis not present

## 2020-07-27 MED ORDER — VITAMIN D (ERGOCALCIFEROL) 1.25 MG (50000 UNIT) PO CAPS: 50000.0000 [IU] | ORAL_CAPSULE | ORAL | 0 refills | Status: DC

## 2020-07-27 NOTE — Progress Notes (Signed)
Chief Complaint:   OBESITY Joe Huffman is here to discuss his progress with his obesity treatment plan along with follow-up of his obesity related diagnoses. Joe Huffman is on the Category 3 Plan and states he is following his eating plan approximately 50% of the time. Joe Huffman states he is walking 12,000 steps 5 times per week.  Today's visit was #: 6 Starting weight: 360 lbs Starting date: 04/27/2020 Today's weight: 338 lbs Today's date: 07/27/2020 Total lbs lost to date: 22 Total lbs lost since last in-office visit: 2  Interim History: Joe Huffman is down 2 lbs, but thought that he had gained because he has been on vacation. He states he has stayed hydrated.  Subjective:   Essential hypertension. Joe Huffman is taking Bystolic. Blood pressure is controlled.  BP Readings from Last 3 Encounters:  07/27/20 (!) 144/73  07/02/20 125/79  06/09/20 (!) 141/78   Lab Results  Component Value Date   CREATININE 0.70 (L) 04/27/2020   Vitamin D deficiency. Joe Huffman is taking Vitamin D supplementation.    Ref. Range 04/27/2020 12:41  Vitamin D, 25-Hydroxy Latest Ref Range: 30.0 - 100.0 ng/mL 23.5 (L)   At risk for heart disease. Joe Huffman is at a higher than average risk for cardiovascular disease due to hypertension.   Assessment/Plan:   Essential hypertension. Joe Huffman is working on healthy weight loss and exercise to improve blood pressure control. We will watch for signs of hypotension as he continues his lifestyle modifications. He will continue his medication as directed.   Vitamin D deficiency. Low Vitamin D level contributes to fatigue and are associated with obesity, breast, and colon cancer. He was given a prescription for Vitamin D, Ergocalciferol, (DRISDOL) 1.25 MG (50000 UNIT) CAPS capsule every week #4 with 0 refills and will follow-up for routine testing of Vitamin D, at least 2-3 times per year to avoid over-replacement.   At risk for heart disease. Joe Huffman  was given approximately 15 minutes of coronary artery disease prevention counseling today. He is 54 y.o. male and has risk factors for heart disease including obesity. We discussed intensive lifestyle modifications today with an emphasis on specific weight loss instructions and strategies.   Repetitive spaced learning was employed today to elicit superior memory formation and behavioral change.  Class 3 severe obesity with serious comorbidity and body mass index (BMI) of 40.0 to 44.9 in adult, unspecified obesity type (HCC).  Joe Huffman is currently in the action stage of change. As such, his goal is to continue with weight loss efforts. He has agreed to the Category 3 Plan.   He will work on meal planning and adhering more closely to the plan.  Exercise goals: Joe Huffman will continue walking and doing yard work.  Behavioral modification strategies: increasing lean protein intake, decreasing simple carbohydrates, increasing vegetables, increasing water intake, decreasing eating out, no skipping meals, meal planning and cooking strategies, keeping healthy foods in the home and planning for success.  Joe Huffman has agreed to follow-up with our clinic in 3 weeks. He was informed of the importance of frequent follow-up visits to maximize his success with intensive lifestyle modifications for his multiple health conditions.   Objective:   Blood pressure (!) 144/73, pulse 70, temperature 98.3 F (36.8 C), height 6\' 1"  (1.854 m), weight (!) 338 lb (153.3 kg), SpO2 97 %. Body mass index is 44.59 kg/m.  General: Cooperative, alert, well developed, in no acute distress. HEENT: Conjunctivae and lids unremarkable. Cardiovascular: Regular rhythm.  Lungs: Normal work of breathing. Neurologic: No focal  deficits.   Lab Results  Component Value Date   CREATININE 0.70 (L) 04/27/2020   BUN 10 04/27/2020   NA 142 04/27/2020   K 4.1 04/27/2020   CL 107 (H) 04/27/2020   CO2 19 (L) 04/27/2020   Lab  Results  Component Value Date   ALT 40 04/27/2020   AST 29 04/27/2020   ALKPHOS 60 04/27/2020   BILITOT 0.8 04/27/2020   Lab Results  Component Value Date   HGBA1C 5.7 (H) 04/27/2020   Lab Results  Component Value Date   INSULIN 41.5 (H) 04/27/2020   Lab Results  Component Value Date   TSH 1.970 04/27/2020   Lab Results  Component Value Date   CHOL 130 04/27/2020   HDL 32 (L) 04/27/2020   LDLCALC 72 04/27/2020   TRIG 148 04/27/2020   No results found for: WBC, HGB, HCT, MCV, PLT No results found for: IRON, TIBC, FERRITIN  Attestation Statements:   Reviewed by clinician on day of visit: allergies, medications, problem list, medical history, surgical history, family history, social history, and previous encounter notes.  Fernanda Drum, am acting as Energy manager for [employer], DO   I have reviewed the above documentation for accuracy and completeness, and I agree with the above. Corinna Capra, DO

## 2020-08-18 ENCOUNTER — Ambulatory Visit (INDEPENDENT_AMBULATORY_CARE_PROVIDER_SITE_OTHER): Payer: No Typology Code available for payment source | Admitting: Bariatrics

## 2020-08-24 ENCOUNTER — Other Ambulatory Visit (INDEPENDENT_AMBULATORY_CARE_PROVIDER_SITE_OTHER): Payer: Self-pay | Admitting: Bariatrics

## 2020-08-24 DIAGNOSIS — E559 Vitamin D deficiency, unspecified: Secondary | ICD-10-CM

## 2020-08-24 NOTE — Telephone Encounter (Signed)
Refill vitamin d

## 2020-08-24 NOTE — Telephone Encounter (Signed)
Dr Brown pt °

## 2020-08-25 NOTE — Telephone Encounter (Signed)
Please advise if okay to refill. 

## 2020-08-25 NOTE — Telephone Encounter (Signed)
Call pt and send the prescription

## 2020-09-02 ENCOUNTER — Other Ambulatory Visit (INDEPENDENT_AMBULATORY_CARE_PROVIDER_SITE_OTHER): Payer: Self-pay | Admitting: Physician Assistant

## 2020-09-02 DIAGNOSIS — I10 Essential (primary) hypertension: Secondary | ICD-10-CM

## 2020-09-02 MED ORDER — NEBIVOLOL HCL 5 MG PO TABS: 5.0000 mg | ORAL_TABLET | Freq: Every day | ORAL | 0 refills | Status: DC

## 2020-09-02 NOTE — Telephone Encounter (Signed)
Refill request

## 2020-09-02 NOTE — Telephone Encounter (Signed)
This patient was last seen by Dr.Brown, and currently has an upcoming appt scheduled on 09/08/20 with her. ° °

## 2020-09-08 ENCOUNTER — Ambulatory Visit (INDEPENDENT_AMBULATORY_CARE_PROVIDER_SITE_OTHER): Payer: No Typology Code available for payment source | Admitting: Bariatrics

## 2020-09-08 ENCOUNTER — Other Ambulatory Visit: Payer: Self-pay

## 2020-09-08 ENCOUNTER — Encounter (INDEPENDENT_AMBULATORY_CARE_PROVIDER_SITE_OTHER): Payer: Self-pay | Admitting: Bariatrics

## 2020-09-08 VITALS — BP 140/76 | HR 70 | Temp 98.6°F | Ht 73.0 in | Wt 339.0 lb

## 2020-09-08 DIAGNOSIS — Z6841 Body Mass Index (BMI) 40.0 and over, adult: Secondary | ICD-10-CM | POA: Diagnosis not present

## 2020-09-08 DIAGNOSIS — I10 Essential (primary) hypertension: Secondary | ICD-10-CM | POA: Diagnosis not present

## 2020-09-09 NOTE — Progress Notes (Signed)
Chief Complaint:   OBESITY Chrishun Scheer is here to discuss his progress with his obesity treatment plan along with follow-up of his obesity related diagnoses. Lemont is on the Category 3 Plan and states he is following his eating plan approximately 10-20% of the time. Jia states he is exercising 0 minutes 0 times per week.  Today's visit was #: 7 Starting weight: 360 lbs Starting date: 04/27/2020 Today's weight: 339 lbs Today's date: 09/08/2020 Total lbs lost to date: 21 Total lbs lost since last in-office visit: 0  Interim History: Daelan is up 1 lb, but has done well overall. He has had less discipline and going out more due to schedule changes.  Subjective:   Essential hypertension. Demarquez reports taking his medication as directed. Blood pressure is reasonably well controlled.  BP Readings from Last 3 Encounters:  09/08/20 140/76  07/27/20 (!) 144/73  07/02/20 125/79   Lab Results  Component Value Date   CREATININE 0.70 (L) 04/27/2020   Assessment/Plan:   Essential hypertension. Yusif is working on healthy weight loss and exercise to improve blood pressure control. We will watch for signs of hypotension as he continues his lifestyle modifications.  He will continue his medication as directed.   Class 3 severe obesity with serious comorbidity and body mass index (BMI) of 40.0 to 44.9 in adult, unspecified obesity type (HCC).  Renold is currently in the action stage of change. As such, his goal is to continue with weight loss efforts. He has agreed to the Category 3 Plan.   He will work on meal planning. Holiday strategies were discussed today.  Exercise goals: Tharun will increase his walking.  Behavioral modification strategies: increasing lean protein intake, decreasing simple carbohydrates, increasing vegetables, increasing water intake, decreasing eating out, no skipping meals, meal planning and cooking strategies, keeping healthy  foods in the home and planning for success.  Kermitt has agreed to follow-up with our clinic fasting in 8 weeks. He was informed of the importance of frequent follow-up visits to maximize his success with intensive lifestyle modifications for his multiple health conditions.   Objective:   Blood pressure 140/76, pulse 70, temperature 98.6 F (37 C), height 6\' 1"  (1.854 m), weight (!) 339 lb (153.8 kg), SpO2 96 %. Body mass index is 44.73 kg/m.  General: Cooperative, alert, well developed, in no acute distress. HEENT: Conjunctivae and lids unremarkable. Cardiovascular: Regular rhythm.  Lungs: Normal work of breathing. Neurologic: No focal deficits.   Lab Results  Component Value Date   CREATININE 0.70 (L) 04/27/2020   BUN 10 04/27/2020   NA 142 04/27/2020   K 4.1 04/27/2020   CL 107 (H) 04/27/2020   CO2 19 (L) 04/27/2020   Lab Results  Component Value Date   ALT 40 04/27/2020   AST 29 04/27/2020   ALKPHOS 60 04/27/2020   BILITOT 0.8 04/27/2020   Lab Results  Component Value Date   HGBA1C 5.7 (H) 04/27/2020   Lab Results  Component Value Date   INSULIN 41.5 (H) 04/27/2020   Lab Results  Component Value Date   TSH 1.970 04/27/2020   Lab Results  Component Value Date   CHOL 130 04/27/2020   HDL 32 (L) 04/27/2020   LDLCALC 72 04/27/2020   TRIG 148 04/27/2020   No results found for: WBC, HGB, HCT, MCV, PLT No results found for: IRON, TIBC, FERRITIN  Attestation Statements:   Reviewed by clinician on day of visit: allergies, medications, problem list, medical history, surgical history,  family history, social history, and previous encounter notes.  Time spent on visit including pre-visit chart review and post-visit charting and care was 30 minutes.   Fernanda Drum, am acting as Energy manager for [employer], DO   I have reviewed the above documentation for accuracy and completeness, and I agree with the above. Corinna Capra, DO

## 2020-09-14 ENCOUNTER — Encounter (INDEPENDENT_AMBULATORY_CARE_PROVIDER_SITE_OTHER): Payer: Self-pay | Admitting: Bariatrics

## 2020-09-19 ENCOUNTER — Other Ambulatory Visit (INDEPENDENT_AMBULATORY_CARE_PROVIDER_SITE_OTHER): Payer: Self-pay | Admitting: Bariatrics

## 2020-09-19 DIAGNOSIS — E559 Vitamin D deficiency, unspecified: Secondary | ICD-10-CM

## 2020-09-21 NOTE — Telephone Encounter (Signed)
This patient was last seen by Dr. Manson Passey, and currently has an upcoming appt scheduled on 11/04/19 with her.

## 2020-09-22 NOTE — Telephone Encounter (Signed)
Last prescribed 08/25/20 Next OV in 8 weeks - 11/04/19 Last Vit.D - 04/27/20 - 23.5

## 2020-09-29 ENCOUNTER — Other Ambulatory Visit (INDEPENDENT_AMBULATORY_CARE_PROVIDER_SITE_OTHER): Payer: Self-pay | Admitting: Physician Assistant

## 2020-09-29 DIAGNOSIS — I10 Essential (primary) hypertension: Secondary | ICD-10-CM

## 2020-09-29 NOTE — Telephone Encounter (Signed)
Refill request

## 2020-09-29 NOTE — Telephone Encounter (Signed)
Dr.Brown 

## 2020-11-03 ENCOUNTER — Ambulatory Visit (INDEPENDENT_AMBULATORY_CARE_PROVIDER_SITE_OTHER): Payer: No Typology Code available for payment source | Admitting: Bariatrics

## 2020-11-24 ENCOUNTER — Ambulatory Visit (INDEPENDENT_AMBULATORY_CARE_PROVIDER_SITE_OTHER): Payer: No Typology Code available for payment source | Admitting: Bariatrics

## 2020-12-21 ENCOUNTER — Ambulatory Visit (INDEPENDENT_AMBULATORY_CARE_PROVIDER_SITE_OTHER): Payer: No Typology Code available for payment source | Admitting: Bariatrics

## 2021-01-11 ENCOUNTER — Ambulatory Visit (INDEPENDENT_AMBULATORY_CARE_PROVIDER_SITE_OTHER): Payer: No Typology Code available for payment source | Admitting: Bariatrics

## 2021-01-11 ENCOUNTER — Encounter (INDEPENDENT_AMBULATORY_CARE_PROVIDER_SITE_OTHER): Payer: Self-pay | Admitting: Bariatrics

## 2021-01-11 ENCOUNTER — Other Ambulatory Visit: Payer: Self-pay

## 2021-01-11 VITALS — BP 146/84 | HR 82 | Temp 98.0°F | Ht 73.0 in | Wt 345.0 lb

## 2021-01-11 DIAGNOSIS — I10 Essential (primary) hypertension: Secondary | ICD-10-CM | POA: Diagnosis not present

## 2021-01-11 DIAGNOSIS — Z6841 Body Mass Index (BMI) 40.0 and over, adult: Secondary | ICD-10-CM

## 2021-01-11 DIAGNOSIS — E559 Vitamin D deficiency, unspecified: Secondary | ICD-10-CM

## 2021-01-12 ENCOUNTER — Encounter (INDEPENDENT_AMBULATORY_CARE_PROVIDER_SITE_OTHER): Payer: Self-pay | Admitting: Bariatrics

## 2021-01-12 NOTE — Progress Notes (Signed)
Chief Complaint:   OBESITY Joe Huffman is here to discuss his progress with his obesity treatment plan along with follow-up of his obesity related diagnoses. [name] is on the Category 3 Plan and states he is following his eating plan approximately 0% of the time. [name] states he is doing 0 minutes 0 times per week.  Today's visit was #: 8 Starting weight: 360 lbs Starting date: 04/27/2020 Today's weight: 345 lbs Today's date: 01/11/2021 Total lbs lost to date: 15 lbs Total lbs lost since last in-office visit: 0  Interim History: Joe Huffman I sup 5 lbs since his last visit. His last visit with Joe Huffman was 09/08/2020. He has not been following a plan. He has had some challenges with getting proper food.   Subjective:   1. Essential hypertension Joe Huffman's BP is reasonably well controlled.  BP Readings from Last 3 Encounters:  01/11/21 (!) 146/84  09/08/20 140/76  07/27/20 (!) 144/73    2. Vitamin D deficiency Joe Huffman's Vitamin D level was 23.5 on 04/27/2020. He is currently taking OTC vitamin D 1,000 units each day. He denies nausea, vomiting or muscle weakness.  Assessment/Plan:   1. Essential hypertension [name] is working on healthy weight loss and exercise to improve blood pressure control. We will watch for signs of hypotension as he continues his lifestyle modifications. Continue Bystolic and no added salt.  2. Vitamin D deficiency Low Vitamin D level contributes to fatigue and are associated with obesity, breast, and colon cancer. He agrees to continue to take OTC Vitamin D @1 ,000 IU everyday and will follow-up for routine testing of Vitamin D, at least 2-3 times per year to avoid over-replacement.  3. obesity current bmi 45.53 Joe Huffman is currently in the action stage of change. As such, his goal is to continue with weight loss efforts. He has agreed to practicing portion control and making smarter food choices, such as increasing vegetables and decreasing  simple carbohydrates.   Recipes II 12/25/2020 labs from [place] reviewed  Exercise goals: As is  Behavioral modification strategies: increasing lean protein intake, decreasing simple carbohydrates, increasing vegetables, increasing water intake, decreasing eating out, no skipping meals, meal planning and cooking strategies, keeping healthy foods in the home and planning for success.  [name] has agreed to follow-up with our clinic in 5 weeks (arrive 30 minutes early for IC). He was informed of the importance of frequent follow-up visits to maximize his success with intensive lifestyle modifications for his multiple health conditions.   Objective:   Blood pressure (!) 146/84, pulse 82, temperature 98 F (36.7 C), height 6\' 1"  (1.854 m), weight (!) 345 lb (156.5 kg), SpO2 97 %. Body mass index is 45.52 kg/m.  General: Cooperative, alert, well developed, in no acute distress. HEENT: Conjunctivae and lids unremarkable. Cardiovascular: Regular rhythm.  Lungs: Normal work of breathing. Neurologic: No focal deficits.   Lab Results  Component Value Date   CREATININE 0.70 (L) 04/27/2020   BUN 10 04/27/2020   NA 142 04/27/2020   K 4.1 04/27/2020   CL 107 (H) 04/27/2020   CO2 19 (L) 04/27/2020   Lab Results  Component Value Date   ALT 40 04/27/2020   AST 29 04/27/2020   ALKPHOS 60 04/27/2020   BILITOT 0.8 04/27/2020   Lab Results  Component Value Date   HGBA1C 5.7 (H) 04/27/2020   Lab Results  Component Value Date   INSULIN 41.5 (H) 04/27/2020   Lab Results  Component Value Date   TSH 1.970 04/27/2020  Lab Results  Component Value Date   CHOL 130 04/27/2020   HDL 32 (L) 04/27/2020   LDLCALC 72 04/27/2020   TRIG 148 04/27/2020    Attestation Statements:   Reviewed by clinician on day of visit: allergies, medications, problem list, medical history, surgical history, family history, social history, and previous encounter notes.  Time spent on visit including  pre-visit chart review and post-visit care and charting was 20 minutes.   Edmund Hilda, am acting as Energy manager for [employer], DO.  I have reviewed the above documentation for accuracy and completeness, and I agree with the above. Corinna Capra, DO

## 2021-01-20 ENCOUNTER — Ambulatory Visit: Payer: Self-pay | Attending: Internal Medicine

## 2021-01-20 DIAGNOSIS — Z23 Encounter for immunization: Secondary | ICD-10-CM

## 2021-01-20 NOTE — Progress Notes (Signed)
   Covid-19 Vaccination Clinic  Name:  Ike Maragh    MRN: [id number] DOB: [birth year]  01/20/2021  Mr. Willert was observed post Covid-19 immunization for 15 minutes without incident. He was provided with Vaccine Information Sheet and instruction to access the V-Safe system.   Mr. Waiters was instructed to call 911 with any severe reactions post vaccine: Marland Kitchen Difficulty breathing  . Swelling of face and throat  . A fast heartbeat  . A bad rash all over body  . Dizziness and weakness   Immunizations Administered    Name Date Dose VIS Date Route   Moderna Covid-19 Booster Vaccine 01/20/2021  9:15 AM 0.25 mL 08/05/2020 Intramuscular   Manufacturer: Gala Murdoch   Lot: [id number]   NDC: 87195-974-71

## 2021-01-21 ENCOUNTER — Other Ambulatory Visit (HOSPITAL_BASED_OUTPATIENT_CLINIC_OR_DEPARTMENT_OTHER): Payer: Self-pay

## 2021-01-22 ENCOUNTER — Encounter (INDEPENDENT_AMBULATORY_CARE_PROVIDER_SITE_OTHER): Payer: Self-pay | Admitting: Bariatrics

## 2021-01-25 NOTE — Telephone Encounter (Signed)
Please review

## 2021-01-25 NOTE — Telephone Encounter (Signed)
Pt last seen by Dr. Brown.  

## 2021-01-28 ENCOUNTER — Other Ambulatory Visit (HOSPITAL_BASED_OUTPATIENT_CLINIC_OR_DEPARTMENT_OTHER): Payer: Self-pay

## 2021-01-28 MED ORDER — COVID-19 MRNA VACC (MODERNA) 100 MCG/0.5ML IM SUSP
INTRAMUSCULAR | 0 refills | Status: DC
  Filled 2021-01-28: qty 0.25, 1d supply, fill #0

## 2021-02-09 ENCOUNTER — Other Ambulatory Visit (HOSPITAL_BASED_OUTPATIENT_CLINIC_OR_DEPARTMENT_OTHER): Payer: Self-pay

## 2021-02-09 MED ORDER — PAXLOVID 20 X 150 MG & 10 X 100MG PO TBPK
ORAL_TABLET | ORAL | 0 refills | Status: DC
  Filled 2021-02-09: qty 30, 5d supply, fill #0

## 2021-02-15 ENCOUNTER — Ambulatory Visit (INDEPENDENT_AMBULATORY_CARE_PROVIDER_SITE_OTHER): Payer: No Typology Code available for payment source | Admitting: Bariatrics

## 2021-02-22 ENCOUNTER — Other Ambulatory Visit (HOSPITAL_BASED_OUTPATIENT_CLINIC_OR_DEPARTMENT_OTHER): Payer: Self-pay

## 2021-02-25 ENCOUNTER — Other Ambulatory Visit (HOSPITAL_BASED_OUTPATIENT_CLINIC_OR_DEPARTMENT_OTHER): Payer: Self-pay

## 2021-02-25 ENCOUNTER — Ambulatory Visit (INDEPENDENT_AMBULATORY_CARE_PROVIDER_SITE_OTHER): Payer: No Typology Code available for payment source | Admitting: Bariatrics

## 2021-03-10 ENCOUNTER — Other Ambulatory Visit (HOSPITAL_BASED_OUTPATIENT_CLINIC_OR_DEPARTMENT_OTHER): Payer: Self-pay

## 2021-03-10 MED ORDER — NEBIVOLOL HCL 5 MG PO TABS
ORAL_TABLET | ORAL | 1 refills | Status: DC
  Filled 2021-03-10 – 2021-03-12 (×2): qty 90, 90d supply, fill #0

## 2021-03-12 ENCOUNTER — Other Ambulatory Visit (HOSPITAL_BASED_OUTPATIENT_CLINIC_OR_DEPARTMENT_OTHER): Payer: Self-pay

## 2021-03-22 ENCOUNTER — Ambulatory Visit (INDEPENDENT_AMBULATORY_CARE_PROVIDER_SITE_OTHER): Payer: No Typology Code available for payment source | Admitting: Bariatrics

## 2021-03-23 ENCOUNTER — Encounter (INDEPENDENT_AMBULATORY_CARE_PROVIDER_SITE_OTHER): Payer: Self-pay | Admitting: Bariatrics

## 2021-03-23 ENCOUNTER — Ambulatory Visit (INDEPENDENT_AMBULATORY_CARE_PROVIDER_SITE_OTHER): Payer: No Typology Code available for payment source | Admitting: Bariatrics

## 2021-03-23 ENCOUNTER — Other Ambulatory Visit: Payer: Self-pay

## 2021-03-23 VITALS — BP 130/85 | HR 75 | Temp 98.6°F | Ht 73.0 in | Wt 343.0 lb

## 2021-03-23 DIAGNOSIS — Z6841 Body Mass Index (BMI) 40.0 and over, adult: Secondary | ICD-10-CM

## 2021-03-23 DIAGNOSIS — R632 Polyphagia: Secondary | ICD-10-CM

## 2021-03-23 DIAGNOSIS — I10 Essential (primary) hypertension: Secondary | ICD-10-CM | POA: Diagnosis not present

## 2021-03-23 DIAGNOSIS — E559 Vitamin D deficiency, unspecified: Secondary | ICD-10-CM

## 2021-03-23 DIAGNOSIS — R7303 Prediabetes: Secondary | ICD-10-CM | POA: Diagnosis not present

## 2021-03-23 DIAGNOSIS — Z9189 Other specified personal risk factors, not elsewhere classified: Secondary | ICD-10-CM

## 2021-03-23 DIAGNOSIS — E786 Lipoprotein deficiency: Secondary | ICD-10-CM

## 2021-03-23 MED ORDER — RYBELSUS 7 MG PO TABS: 7.0000 mg | ORAL_TABLET | Freq: Every day | ORAL | 0 refills | Status: DC

## 2021-03-24 LAB — LIPID PANEL WITH LDL/HDL RATIO
Cholesterol, Total: 112 mg/dL (ref 100–199)
HDL: 32 mg/dL — ABNORMAL LOW (ref >39)
LDL Chol Calc (NIH): 61 mg/dL (ref 0–99)
LDL/HDL Ratio: 1.9 ratio (ref 0.0–3.6)
Triglycerides: 99 mg/dL (ref 0–149)
VLDL Cholesterol Cal: 19 mg/dL (ref 5–40)

## 2021-03-24 LAB — VITAMIN D 25 HYDROXY (VIT D DEFICIENCY, FRACTURES): Vit D, 25-Hydroxy: 43.3 ng/mL (ref 30.0–100.0)

## 2021-03-24 LAB — COMPREHENSIVE METABOLIC PANEL
ALT: 36 IU/L (ref 0–44)
AST: 24 IU/L (ref 0–40)
Albumin/Globulin Ratio: 1.8 (ref 1.2–2.2)
Albumin: 4.6 g/dL (ref 3.8–4.9)
Alkaline Phosphatase: 70 IU/L (ref 44–121)
BUN/Creatinine Ratio: 14 (ref 9–20)
BUN: 12 mg/dL (ref 6–24)
Bilirubin Total: 0.8 mg/dL (ref 0.0–1.2)
CO2: 20 mmol/L (ref 20–29)
Calcium: 9.2 mg/dL (ref 8.7–10.2)
Chloride: 108 mmol/L — ABNORMAL HIGH (ref 96–106)
Creatinine, Ser: 0.86 mg/dL (ref 0.76–1.27)
Globulin, Total: 2.6 g/dL (ref 1.5–4.5)
Glucose: 85 mg/dL (ref 65–99)
Potassium: 4.4 mmol/L (ref 3.5–5.2)
Sodium: 143 mmol/L (ref 134–144)
Total Protein: 7.2 g/dL (ref 6.0–8.5)
eGFR: 104 mL/min/{1.73_m2} (ref >59)

## 2021-03-24 LAB — HEMOGLOBIN A1C
Est. average glucose Bld gHb Est-mCnc: 117 mg/dL
Hgb A1c MFr Bld: 5.7 % — ABNORMAL HIGH (ref 4.8–5.6)

## 2021-03-24 LAB — INSULIN, RANDOM: INSULIN: 36 u[IU]/mL — ABNORMAL HIGH (ref 2.6–24.9)

## 2021-03-29 ENCOUNTER — Encounter (INDEPENDENT_AMBULATORY_CARE_PROVIDER_SITE_OTHER): Payer: Self-pay | Admitting: Bariatrics

## 2021-03-29 NOTE — Progress Notes (Signed)
Chief Complaint:   OBESITY Joe Huffman is here to discuss his progress with his obesity treatment plan along with follow-up of his obesity related diagnoses. Little is on practicing portion control and making smarter food choices, such as increasing vegetables and decreasing simple carbohydrates and states he is following his eating plan approximately 0% of the time. Joe Huffman states he is not currently exercising.   Today's visit was #: 9 Starting weight: 360 lbs Starting date: 04/27/2020 Today's weight: 343 lbs Today's date: 03/23/2021 Total lbs lost to date: 17 lbs Total lbs lost since last in-office visit: 2  Interim History: Joe Huffman is down an additional 2 lbs since his last visit. He has had multiple trips and will have multiple trips over the summer.   Subjective:   1. Essential hypertension Joe Huffman's BP is reasonably well controlled.   BP Readings from Last 3 Encounters:  03/23/21 130/85  01/11/21 (!) 146/84  09/08/20 140/76    2. Pre-diabetes Joe Huffman is not on medication.   3. Polyphagia Joe Huffman is not on medication. He was on Rybelsus 3 mg for 2 weeks.  4. Vitamin D insufficiency Joe Huffman denies nausea, vomiting, and muscle weakness.  5. Low HDL (under 40) Joe Huffman is not on statin therapy.  6. At risk for hypoglycemia Joe Huffman is at risk for hypoglycemia due to dietary changes.  Assessment/Plan:   1. Essential hypertension Joe Huffman is working on healthy weight loss and exercise to improve blood pressure control. We will watch for signs of hypotension as he continues his lifestyle modifications. -Continue current treatment plan. - Comprehensive metabolic panel  2. Pre-diabetes Joe Huffman will continue to work on weight loss, exercise, and decreasing simple carbohydrates to help decrease the risk of diabetes.  -Rybelsus 7 mg daily, Disp #30, 0 refills  - Hemoglobin A1c - Insulin, random  3. Polyphagia Intensive lifestyle modifications  are the first line treatment for this issue. We discussed several lifestyle modifications today and he will continue to work on diet, exercise and weight loss efforts. Orders and follow up as documented in patient record.  Counseling Polyphagia is excessive hunger. Causes can include: low blood sugars, hypERthyroidism, PMS, lack of sleep, stress, insulin resistance, diabetes, certain medications, and diets that are deficient in protein and fiber.   - Hemoglobin A1c - Insulin, random  4. Vitamin D insufficiency Low Vitamin D level contributes to fatigue and are associated with obesity, breast, and colon cancer. He agrees to continue to take Vitamin D @1 ,000 IU and will follow-up for routine testing of Vitamin D, at least 2-3 times per year to avoid over-replacement.  - VITAMIN D 25 Hydroxy (Vit-D Deficiency, Fractures)  5. Low HDL (under 40) Cardiovascular risk and specific lipid/LDL goals reviewed.  We discussed several lifestyle modifications today and Joe Huffman will continue to work on diet, exercise and weight loss efforts. Orders and follow up as documented in patient record.   Counseling Intensive lifestyle modifications are the first line treatment for this issue. Dietary changes: Increase soluble fiber. Decrease simple carbohydrates. Exercise changes: Moderate to vigorous-intensity aerobic activity 150 minutes per week if tolerated. Lipid-lowering medications: see documented in medical record.  - Lipid Panel With LDL/HDL Ratio - Comprehensive metabolic panel  6. At risk for hypoglycemia Joe Huffman was given approximately 15 minutes of counseling today regarding prevention of hypoglycemia. He was advised of symptoms of hypoglycemia. Joe Huffman was instructed to avoid skipping meals, eat regular protein rich meals and schedule low calorie snacks as needed.   Repetitive spaced learning was employed today  to elicit superior memory formation and behavioral change   7. Obesity with  current BMI of 45.3  Joe Huffman is currently in the action stage of change. As such, his goal is to continue with weight loss efforts. He has agreed to practicing portion control and making smarter food choices, such as increasing vegetables and decreasing simple carbohydrates.   Will continue to decrease carbohydrates and increase healthy fats and proteins.  Exercise goals:  Pt will go to [name].  Behavioral modification strategies: increasing lean protein intake, decreasing simple carbohydrates, increasing vegetables, increasing water intake, decreasing eating out, no skipping meals, meal planning and cooking strategies, keeping healthy foods in the home, ways to avoid boredom eating, ways to avoid night time snacking, better snacking choices, emotional eating strategies, and avoiding temptations.  Joe Huffman has agreed to follow-up with our clinic in 4 weeks- fasting. He was informed of the importance of frequent follow-up visits to maximize his success with intensive lifestyle modifications for his multiple health conditions.   Objective:   Blood pressure 130/85, pulse 75, temperature 98.6 F (37 C), height 6\' 1"  (1.854 m), weight (!) 343 lb (155.6 kg), SpO2 95 %. Body mass index is 45.25 kg/m.  General: Cooperative, alert, well developed, in no acute distress. HEENT: Conjunctivae and lids unremarkable. Cardiovascular: Regular rhythm.  Lungs: Normal work of breathing. Neurologic: No focal deficits.   Lab Results  Component Value Date   CREATININE 0.86 03/23/2021   BUN 12 03/23/2021   NA 143 03/23/2021   K 4.4 03/23/2021   CL 108 (H) 03/23/2021   CO2 20 03/23/2021   Lab Results  Component Value Date   ALT 36 03/23/2021   AST 24 03/23/2021   ALKPHOS 70 03/23/2021   BILITOT 0.8 03/23/2021   Lab Results  Component Value Date   HGBA1C 5.7 (H) 03/23/2021   HGBA1C 5.7 (H) 04/27/2020   Lab Results  Component Value Date   INSULIN 36.0 (H) 03/23/2021   INSULIN 41.5 (H)  04/27/2020   Lab Results  Component Value Date   TSH 1.970 04/27/2020   Lab Results  Component Value Date   CHOL 112 03/23/2021   HDL 32 (L) 03/23/2021   LDLCALC 61 03/23/2021   TRIG 99 03/23/2021   No results found for: WBC, HGB, HCT, MCV, PLT No results found for: IRON, TIBC, FERRITIN   Attestation Statements:   Reviewed by clinician on day of visit: allergies, medications, problem list, medical history, surgical history, family history, social history, and previous encounter notes.  05/23/2021, CMA, am acting as Edmund Hilda for Energy manager, DO.  I have reviewed the above documentation for accuracy and completeness, and I agree with the above. [employer], DO

## 2021-04-02 ENCOUNTER — Encounter (INDEPENDENT_AMBULATORY_CARE_PROVIDER_SITE_OTHER): Payer: Self-pay | Admitting: Bariatrics

## 2021-04-05 NOTE — Telephone Encounter (Signed)
Pt last seen by Dr. Brown.  

## 2021-05-11 ENCOUNTER — Ambulatory Visit (INDEPENDENT_AMBULATORY_CARE_PROVIDER_SITE_OTHER): Payer: No Typology Code available for payment source | Admitting: Bariatrics

## 2021-05-11 ENCOUNTER — Other Ambulatory Visit (HOSPITAL_BASED_OUTPATIENT_CLINIC_OR_DEPARTMENT_OTHER): Payer: Self-pay

## 2021-05-11 ENCOUNTER — Encounter (INDEPENDENT_AMBULATORY_CARE_PROVIDER_SITE_OTHER): Payer: Self-pay | Admitting: Bariatrics

## 2021-05-11 ENCOUNTER — Other Ambulatory Visit: Payer: Self-pay

## 2021-05-11 VITALS — BP 128/70 | HR 85 | Temp 98.2°F | Ht 73.0 in | Wt 333.0 lb

## 2021-05-11 DIAGNOSIS — R7303 Prediabetes: Secondary | ICD-10-CM | POA: Diagnosis not present

## 2021-05-11 DIAGNOSIS — Z6841 Body Mass Index (BMI) 40.0 and over, adult: Secondary | ICD-10-CM

## 2021-05-11 DIAGNOSIS — I10 Essential (primary) hypertension: Secondary | ICD-10-CM | POA: Diagnosis not present

## 2021-05-11 DIAGNOSIS — R632 Polyphagia: Secondary | ICD-10-CM | POA: Diagnosis not present

## 2021-05-11 DIAGNOSIS — Z9189 Other specified personal risk factors, not elsewhere classified: Secondary | ICD-10-CM | POA: Diagnosis not present

## 2021-05-11 MED ORDER — NEBIVOLOL HCL 5 MG PO TABS
ORAL_TABLET | ORAL | 0 refills | Status: DC
  Filled 2021-05-11: qty 90, 90d supply, fill #0

## 2021-05-11 MED ORDER — RYBELSUS 14 MG PO TABS
14.0000 mg | ORAL_TABLET | Freq: Every day | ORAL | 0 refills | Status: DC
Start: 2021-05-11 — End: 2021-06-08
  Filled 2021-05-11: qty 30, fill #0

## 2021-05-16 ENCOUNTER — Encounter (INDEPENDENT_AMBULATORY_CARE_PROVIDER_SITE_OTHER): Payer: Self-pay | Admitting: Bariatrics

## 2021-05-16 NOTE — Progress Notes (Signed)
Chief Complaint:   OBESITY Joe Huffman is here to discuss his progress with his obesity treatment plan along with follow-up of his obesity related diagnoses. Joe Huffman is on practicing portion control and making smarter food choices, such as increasing vegetables and decreasing simple carbohydrates and states he is following his eating plan approximately 0% of the time. Joe Huffman states he is walking 10,000 steps  6 times per week.  Today's visit was #: 10 Starting weight: 360 lbs Starting date: 04/27/2020 Today's weight: 333 lbs Today's date: 05/11/2021 Total lbs lost to date: 27 lbs Total lbs lost since last in-office visit: 10 lbs  Interim History: Joe Huffman is down an additional 10 lbs. He is currently taking Rybelsus.  Subjective:   1. Essential hypertension Review: taking medications as instructed, no medication side effects noted, no chest pain on exertion, no dyspnea on exertion, no swelling of ankles. Joe Huffman's hypertension is controlled.  BP Readings from Last 3 Encounters:  05/11/21 128/70  03/23/21 130/85  01/11/21 (!) 146/84   2. Pre-diabetes Joe Huffman has a diagnosis of prediabetes based on his elevated HgA1c and was informed this puts him at greater risk of developing diabetes. He continues to work on diet and exercise to decrease his risk of diabetes. He denies nausea or hypoglycemia.   Lab Results  Component Value Date   HGBA1C 5.7 (H) 03/23/2021   Lab Results  Component Value Date   INSULIN 36.0 (H) 03/23/2021   INSULIN 41.5 (H) 04/27/2020   3. Polyphagia Joe Huffman has a diagnosis of prediabetes based on his elevated HgA1c and was informed this puts him at greater risk of developing diabetes. He continues to work on diet and exercise to decrease his risk of diabetes. He denies nausea or hypoglycemia.  Joe Huffman has lost 10 lbs since his last visit and taking Rybelsus.  Lab Results  Component Value Date   HGBA1C 5.7 (H) 03/23/2021   Lab Results   Component Value Date   INSULIN 36.0 (H) 03/23/2021   INSULIN 41.5 (H) 04/27/2020   4. At risk for side effect of medication Joe Huffman is at risk for nausea, vomiting, and constipation due to increasing Rybelsus.   Assessment/Plan:   1. Essential hypertension Joe Huffman is working on healthy weight loss and exercise to improve blood pressure control. We will watch for signs of hypotension as he continues his lifestyle modifications.  - nebivolol (BYSTOLIC) 5 MG tablet; Take 1 (one) tablet by mouth once daily.  Dispense: 90 tablet; Refill: 0  2. Pre-diabetes Joe Huffman will continue to work on weight loss, exercise, and decreasing simple carbohydrates to help decrease the risk of diabetes.   - Semaglutide (RYBELSUS) 14 MG TABS; Take 14 mg by mouth daily.  Dispense: 30 tablet; Refill: 0  3. Polyphagia Intensive lifestyle modifications are the first line treatment for this issue. We discussed several lifestyle modifications today and he will continue to work on diet, exercise and weight loss efforts. Orders and follow up as documented in patient record. Joe Huffman will continue taking Rybelsus.  Counseling Polyphagia is excessive hunger. Causes can include: low blood sugars, hypERthyroidism, PMS, lack of sleep, stress, insulin resistance, diabetes, certain medications, and diets that are deficient in protein and fiber.    4. At risk for side effect of medication Joe Huffman was given approximately 15 minutes of drug side effect counseling today.  We discussed side effect possibility and risk versus benefits. Joe Huffman agreed to the medication and will contact this office if these side effects are intolerable.  Repetitive spaced  learning was employed today to elicit superior memory formation and behavioral change.   5. Obesity with current BMI of 43.9 Joe Huffman is currently in the action stage of change. As such, his goal is to continue with weight loss efforts. He has agreed to practicing  portion control and making smarter food choices, such as increasing vegetables and decreasing simple carbohydrates.   Meal planning Intentional eating  Exercise goals: will continue walking more.  Behavioral modification strategies: increasing lean protein intake, decreasing simple carbohydrates, increasing vegetables, increasing water intake, ways to avoid night time snacking, better snacking choices, emotional eating strategies, and planning for success.  Joe Huffman has agreed to follow-up with our clinic in 4 weeks. He was informed of the importance of frequent follow-up visits to maximize his success with intensive lifestyle modifications for his multiple health conditions.   Objective:   Blood pressure 128/70, pulse 85, temperature 98.2 F (36.8 C), height 6\' 1"  (1.854 m), weight (!) 333 lb (151 kg), SpO2 97 %. Body mass index is 43.93 kg/m.  General: Cooperative, alert, well developed, in no acute distress. HEENT: Conjunctivae and lids unremarkable. Cardiovascular: Regular rhythm.  Lungs: Normal work of breathing. Neurologic: No focal deficits.   Lab Results  Component Value Date   CREATININE 0.86 03/23/2021   BUN 12 03/23/2021   NA 143 03/23/2021   K 4.4 03/23/2021   CL 108 (H) 03/23/2021   CO2 20 03/23/2021   Lab Results  Component Value Date   ALT 36 03/23/2021   AST 24 03/23/2021   ALKPHOS 70 03/23/2021   BILITOT 0.8 03/23/2021   Lab Results  Component Value Date   HGBA1C 5.7 (H) 03/23/2021   HGBA1C 5.7 (H) 04/27/2020   Lab Results  Component Value Date   INSULIN 36.0 (H) 03/23/2021   INSULIN 41.5 (H) 04/27/2020   Lab Results  Component Value Date   TSH 1.970 04/27/2020   Lab Results  Component Value Date   CHOL 112 03/23/2021   HDL 32 (L) 03/23/2021   LDLCALC 61 03/23/2021   TRIG 99 03/23/2021   Lab Results  Component Value Date   VD25OH 43.3 03/23/2021   VD25OH 23.5 (L) 04/27/2020   No results found for: WBC, HGB, HCT, MCV, PLT No results  found for: IRON, TIBC, FERRITIN  Attestation Statements:   Reviewed by clinician on day of visit: allergies, medications, problem list, medical history, surgical history, family history, social history, and previous encounter notes.  I09/09/2020, CMA, am acting as transcriptionist for Dr. Paulla Fore, DO.  I have reviewed the above documentation for accuracy and completeness, and I agree with the above. Corinna Capra, DO

## 2021-06-08 ENCOUNTER — Ambulatory Visit (INDEPENDENT_AMBULATORY_CARE_PROVIDER_SITE_OTHER): Payer: No Typology Code available for payment source | Admitting: Bariatrics

## 2021-06-08 ENCOUNTER — Encounter (INDEPENDENT_AMBULATORY_CARE_PROVIDER_SITE_OTHER): Payer: Self-pay | Admitting: Bariatrics

## 2021-06-08 ENCOUNTER — Other Ambulatory Visit (INDEPENDENT_AMBULATORY_CARE_PROVIDER_SITE_OTHER): Payer: Self-pay | Admitting: Bariatrics

## 2021-06-08 ENCOUNTER — Other Ambulatory Visit: Payer: Self-pay

## 2021-06-08 VITALS — BP 143/78 | HR 79 | Temp 98.2°F | Ht 73.0 in | Wt 334.0 lb

## 2021-06-08 DIAGNOSIS — R7303 Prediabetes: Secondary | ICD-10-CM

## 2021-06-08 DIAGNOSIS — R632 Polyphagia: Secondary | ICD-10-CM | POA: Diagnosis not present

## 2021-06-08 DIAGNOSIS — Z9189 Other specified personal risk factors, not elsewhere classified: Secondary | ICD-10-CM | POA: Diagnosis not present

## 2021-06-08 MED ORDER — RYBELSUS 14 MG PO TABS: 14.0000 mg | ORAL_TABLET | Freq: Every day | ORAL | 0 refills | Status: DC

## 2021-06-08 NOTE — Telephone Encounter (Signed)
Pt last seen by Dr. Brown.  

## 2021-06-08 NOTE — Progress Notes (Signed)
Chief Complaint:   OBESITY Joe Huffman is here to discuss his progress with his obesity treatment plan along with follow-up of his obesity related diagnoses. Joe Huffman is on practicing portion control and making smarter food choices, such as increasing vegetables and decreasing simple carbohydrates and states he is following his eating plan approximately 0% of the time. Joe Huffman states he is doing 0 minutes 0 times per week.  Today's visit was #: 11 Starting weight: 360 lbs Starting date: 04/27/2020 Today's weight: 334 lbs Today's date: 06/08/2021 Total lbs lost to date: 26 lbs Total lbs lost since last in-office visit: 0  Interim History: Calyx is up 1 lb since his last visit. He has been traveling and engaged in multiple activities,  but will be back to his routine next week.   Subjective:   1. Pre-diabetes Joe Huffman is currently taking his medication as directed.  2. Polyphagia Joe Huffman is currently taking Rybelsus.  3. At risk for diabetes mellitus Joe Huffman is at risk for diabetes mellitus due to Pre-diabetes.  Assessment/Plan:   1. Pre-diabetes We will refill Rybelsus 14 mg take by mouth daily for 1 month with no refills. Joe Huffman will continue to work on weight loss, exercise, and decreasing simple carbohydrates to help decrease the risk of diabetes.   - Semaglutide (RYBELSUS) 14 MG TABS; Take 14 mg by mouth daily.  Dispense: 30 tablet; Refill: 0  2. Polyphagia Intensive lifestyle modifications are the first line treatment for this issue. We discussed several lifestyle modifications today and he will continue to work on diet, exercise and weight loss efforts. Joe Huffman will keep protein and water intake high. He will adhere closely to the plan. Orders and follow up as documented in patient record.  Counseling Polyphagia is excessive hunger. Causes can include: low blood sugars, hypERthyroidism, PMS, lack of sleep, stress, insulin resistance, diabetes, certain  medications, and diets that are deficient in protein and fiber.    3. At risk for diabetes mellitus   Joe Huffman is currently in the action stage of change. As such, his goal is to continue with weight loss efforts. He has agreed to practicing portion control and making smarter food choices, such as increasing vegetables and decreasing simple carbohydrates.   Joe Huffman will continue meal planning. He will keep protein and water intake high. Handout: Tips for meal planning was given today.  Exercise goals:  Joe Huffman will start at [facility] before the next visit.  Behavioral modification strategies: increasing lean protein intake, decreasing simple carbohydrates, increasing vegetables, increasing water intake, decreasing eating out, no skipping meals, meal planning and cooking strategies, keeping healthy foods in the home, and planning for success.  Joe Huffman has agreed to follow-up with our clinic in 4 weeks. He was informed of the importance of frequent follow-up visits to maximize his success with intensive lifestyle modifications for his multiple health conditions.   Objective:   Blood pressure (!) 143/78, pulse 79, temperature 98.2 F (36.8 C), height 6\' 1"  (1.854 m), weight (!) 334 lb (151.5 kg), SpO2 97 %. Body mass index is 44.07 kg/m.  General: Cooperative, alert, well developed, in no acute distress. HEENT: Conjunctivae and lids unremarkable. Cardiovascular: Regular rhythm.  Lungs: Normal work of breathing. Neurologic: No focal deficits.   Lab Results  Component Value Date   CREATININE 0.86 03/23/2021   BUN 12 03/23/2021   NA 143 03/23/2021   K 4.4 03/23/2021   CL 108 (H) 03/23/2021   CO2 20 03/23/2021   Lab Results  Component Value Date  ALT 36 03/23/2021   AST 24 03/23/2021   ALKPHOS 70 03/23/2021   BILITOT 0.8 03/23/2021   Lab Results  Component Value Date   HGBA1C 5.7 (H) 03/23/2021   HGBA1C 5.7 (H) 04/27/2020   Lab Results  Component Value Date    INSULIN 36.0 (H) 03/23/2021   INSULIN 41.5 (H) 04/27/2020   Lab Results  Component Value Date   TSH 1.970 04/27/2020   Lab Results  Component Value Date   CHOL 112 03/23/2021   HDL 32 (L) 03/23/2021   LDLCALC 61 03/23/2021   TRIG 99 03/23/2021   Lab Results  Component Value Date   VD25OH 43.3 03/23/2021   VD25OH 23.5 (L) 04/27/2020   No results found for: WBC, HGB, HCT, MCV, PLT No results found for: IRON, TIBC, FERRITIN  Attestation Statements:   Reviewed by clinician on day of visit: allergies, medications, problem list, medical history, surgical history, family history, social history, and previous encounter notes.  I, Jackson Latino, RMA, am acting as Energy manager for [employer], DO.   I have reviewed the above documentation for accuracy and completeness, and I agree with the above. Corinna Capra, DO

## 2021-06-09 ENCOUNTER — Encounter (INDEPENDENT_AMBULATORY_CARE_PROVIDER_SITE_OTHER): Payer: Self-pay | Admitting: Bariatrics

## 2021-07-02 ENCOUNTER — Other Ambulatory Visit (HOSPITAL_BASED_OUTPATIENT_CLINIC_OR_DEPARTMENT_OTHER): Payer: Self-pay

## 2021-07-05 ENCOUNTER — Encounter (INDEPENDENT_AMBULATORY_CARE_PROVIDER_SITE_OTHER): Payer: Self-pay

## 2021-07-05 ENCOUNTER — Other Ambulatory Visit (INDEPENDENT_AMBULATORY_CARE_PROVIDER_SITE_OTHER): Payer: Self-pay | Admitting: Bariatrics

## 2021-07-05 DIAGNOSIS — R7303 Prediabetes: Secondary | ICD-10-CM

## 2021-07-05 NOTE — Telephone Encounter (Signed)
Message sent to pt-CAS 

## 2021-07-05 NOTE — Telephone Encounter (Signed)
Pt last seen by Dr. Brown.  

## 2021-07-06 ENCOUNTER — Other Ambulatory Visit (INDEPENDENT_AMBULATORY_CARE_PROVIDER_SITE_OTHER): Payer: Self-pay | Admitting: Bariatrics

## 2021-07-06 ENCOUNTER — Other Ambulatory Visit (HOSPITAL_BASED_OUTPATIENT_CLINIC_OR_DEPARTMENT_OTHER): Payer: Self-pay

## 2021-07-06 ENCOUNTER — Ambulatory Visit: Payer: Self-pay | Attending: Internal Medicine

## 2021-07-06 DIAGNOSIS — R7303 Prediabetes: Secondary | ICD-10-CM

## 2021-07-06 DIAGNOSIS — Z23 Encounter for immunization: Secondary | ICD-10-CM

## 2021-07-06 MED ORDER — PFIZER COVID-19 VAC BIVALENT 30 MCG/0.3ML IM SUSP
INTRAMUSCULAR | 0 refills | Status: AC
  Filled 2021-07-06: qty 0.3, 1d supply, fill #0

## 2021-07-06 NOTE — Progress Notes (Signed)
   Covid-19 Vaccination Clinic  Name:  Joe Huffman    MRN: [id number] DOB: [birth year]  07/06/2021  Mr. Joe Huffman was observed post Covid-19 immunization for 15 minutes without incident. He was provided with Vaccine Information Sheet and instruction to access the V-Safe system.   Joe Huffman was instructed to call 911 with any severe reactions post vaccine: Difficulty breathing  Swelling of face and throat  A fast heartbeat  A bad rash all over body  Dizziness and weakness

## 2021-07-12 ENCOUNTER — Ambulatory Visit (INDEPENDENT_AMBULATORY_CARE_PROVIDER_SITE_OTHER): Payer: No Typology Code available for payment source | Admitting: Bariatrics

## 2021-07-13 ENCOUNTER — Other Ambulatory Visit (INDEPENDENT_AMBULATORY_CARE_PROVIDER_SITE_OTHER): Payer: Self-pay | Admitting: Bariatrics

## 2021-07-13 ENCOUNTER — Encounter (INDEPENDENT_AMBULATORY_CARE_PROVIDER_SITE_OTHER): Payer: Self-pay | Admitting: Bariatrics

## 2021-07-13 ENCOUNTER — Other Ambulatory Visit (HOSPITAL_BASED_OUTPATIENT_CLINIC_OR_DEPARTMENT_OTHER): Payer: Self-pay

## 2021-07-13 DIAGNOSIS — R7303 Prediabetes: Secondary | ICD-10-CM

## 2021-07-13 MED ORDER — RYBELSUS 14 MG PO TABS: 14.0000 mg | ORAL_TABLET | Freq: Every day | ORAL | 0 refills | Status: DC

## 2021-07-13 MED ORDER — RYBELSUS 14 MG PO TABS
ORAL_TABLET | ORAL | 0 refills | Status: DC
  Filled 2021-07-13: qty 30, 30d supply, fill #0

## 2021-07-13 NOTE — Telephone Encounter (Signed)
Pt called in regarding a refill on his Rybelsus. The pt said it was denied 2 times. I told him that I will send a message but the nurses are in clinic. He was requesting a time that someone would reach out to him. I couldn't give him a time. Please advise

## 2021-07-13 NOTE — Telephone Encounter (Signed)
LAST APPOINTMENT DATE: 06/08/21 NEXT APPOINTMENT DATE: 07/29/21   [facility] [id number] Ginette Otto, [place] - [address] [place] [address] Phone: [phone] Fax: [phone]  [facility] at [facility] [address] [place] [address] Phone: [phone] Fax: [phone]  Patient is requesting a refill of the following medications: No prescriptions requested or ordered in this encounter   Date last filled: 06/08/21 Previously prescribed by Dr. Manson Passey  Lab Results      Component                Value               Date                      HGBA1C                   5.7 (H)             03/23/2021                HGBA1C                   5.7 (H)             04/27/2020           Lab Results      Component                Value               Date                      LDLCALC                  61                  03/23/2021                CREATININE               0.86                03/23/2021           Lab Results      Component                Value               Date                      VD25OH                   43.3                03/23/2021                VD25OH                   23.5 (L)            04/27/2020            BP Readings from Last 3 Encounters: 06/08/21 : (!) 143/78 05/11/21 : 128/70 03/23/21 : 130/85

## 2021-07-14 ENCOUNTER — Other Ambulatory Visit (HOSPITAL_BASED_OUTPATIENT_CLINIC_OR_DEPARTMENT_OTHER): Payer: Self-pay

## 2021-07-29 ENCOUNTER — Ambulatory Visit (INDEPENDENT_AMBULATORY_CARE_PROVIDER_SITE_OTHER): Payer: No Typology Code available for payment source | Admitting: Bariatrics

## 2021-08-12 ENCOUNTER — Other Ambulatory Visit (HOSPITAL_BASED_OUTPATIENT_CLINIC_OR_DEPARTMENT_OTHER): Payer: Self-pay

## 2021-08-12 ENCOUNTER — Other Ambulatory Visit (INDEPENDENT_AMBULATORY_CARE_PROVIDER_SITE_OTHER): Payer: Self-pay | Admitting: Bariatrics

## 2021-08-12 MED ORDER — RYBELSUS 14 MG PO TABS
ORAL_TABLET | ORAL | 0 refills | Status: DC
  Filled 2021-08-12: qty 30, 30d supply, fill #0

## 2021-08-12 NOTE — Telephone Encounter (Signed)
LAST APPOINTMENT DATE: 07/29/21 NEXT APPOINTMENT DATE: 08/19/21   [facility] [id number] Ginette Otto, [place] - [address] [place] [address] Phone: [phone] Fax: [phone]  [facility] at [facility] [address] [place] [address] Phone: [phone] Fax: [phone]  Patient is requesting a refill of the following medications: Pending Prescriptions:                       Disp   Refills   Semaglutide (RYBELSUS) 14 MG TABS          30 tab*0       Sig: Take 1 tablet (14 mg) by mouth once daily.   Date last filled: 07/13/21 Previously prescribed by Dr. Manson Passey  Lab Results      Component                Value               Date                      HGBA1C                   5.7 (H)             03/23/2021                HGBA1C                   5.7 (H)             04/27/2020           Lab Results      Component                Value               Date                      LDLCALC                  61                  03/23/2021                CREATININE               0.86                03/23/2021           Lab Results      Component                Value               Date                      VD25OH                   43.3                03/23/2021                VD25OH                   23.5 (L)            04/27/2020            BP Readings from Last 3  Encounters: 06/08/21 : (!) 143/78 05/11/21 : 128/70 03/23/21 : 130/85

## 2021-08-19 ENCOUNTER — Ambulatory Visit (INDEPENDENT_AMBULATORY_CARE_PROVIDER_SITE_OTHER): Payer: No Typology Code available for payment source | Admitting: Bariatrics

## 2021-08-19 ENCOUNTER — Other Ambulatory Visit: Payer: Self-pay

## 2021-08-19 ENCOUNTER — Encounter (INDEPENDENT_AMBULATORY_CARE_PROVIDER_SITE_OTHER): Payer: Self-pay | Admitting: Bariatrics

## 2021-08-19 VITALS — BP 123/80 | HR 88 | Temp 98.0°F | Ht 72.0 in | Wt 342.0 lb

## 2021-08-19 DIAGNOSIS — R632 Polyphagia: Secondary | ICD-10-CM | POA: Diagnosis not present

## 2021-08-19 DIAGNOSIS — I10 Essential (primary) hypertension: Secondary | ICD-10-CM

## 2021-08-19 DIAGNOSIS — R7303 Prediabetes: Secondary | ICD-10-CM

## 2021-08-19 DIAGNOSIS — Z6841 Body Mass Index (BMI) 40.0 and over, adult: Secondary | ICD-10-CM

## 2021-08-19 NOTE — Progress Notes (Signed)
Chief Complaint:   OBESITY Joe Huffman is here to discuss his progress with his obesity treatment plan along with follow-up of his obesity related diagnoses. Joe Huffman is on the PC/[place] plan and states he is following his eating plan approximately 0% of the time. Joe Huffman states he is doing 0 minutes 0 times per week.  Today's visit was #: 12 Starting weight: 360 lbs Starting date: 04/27/2020 Today's weight: 342 lbs Today's date: 08/19/2021 Total lbs lost to date: 18 lbs Total lbs lost since last in-office visit: 0  Interim History: Joe Huffman is up 8 lbs since his last visit. He is not following the plan at this time. He is very stressed with more activities.   Subjective:   1. Essential hypertension Joe Huffman's blood pressure is controlled.   2. Pre-diabetes Joe Huffman is currently taking Rybelsus. His last A1C level was 5.7.  3. Polyphagia Joe Huffman is taking Rybelsus currently.   Assessment/Plan:   1. Essential hypertension Joe Huffman will continue his medications. He is working on healthy weight loss and exercise to improve blood pressure control. We will watch for signs of hypotension as he continues his lifestyle modifications.  2. Pre-diabetes Joe Huffman will continue taking Rybelsus until completed and then stop and begin Mounjaro.  . We discussed Mounjaro and he wants to start it.  He will continue to work on weight loss, exercise, and decreasing simple carbohydrates to help decrease the risk of diabetes.   Rx: Mounjaro 5 mg into the skin weekly, Disp: 2 ml with no refills.   3. Polyphagia Intensive lifestyle modifications are the first line treatment for this issue. Joe Huffman will decreased carbohydrates and increase healthy protein and fats. He will get back on plan. We discussed several lifestyle modifications today and he will continue to work on diet, exercise and weight loss efforts. Orders and follow up as documented in patient record.  Counseling Polyphagia is  excessive hunger. Causes can include: low blood sugars, hypERthyroidism, PMS, lack of sleep, stress, insulin resistance, diabetes, certain medications, and diets that are deficient in protein and fiber.    4. Obesity with current BMI of 46.5 Joe Huffman is currently in the action stage of change. As such, his goal is to continue with weight loss efforts. He has agreed to continue the PC/[place].    Joe Huffman will continue meal planning and intentional eating.  Exercise goals: No exercise has been prescribed at this time.  Behavioral modification strategies: increasing lean protein intake, decreasing simple carbohydrates, increasing vegetables, increasing water intake, decreasing eating out, no skipping meals, meal planning and cooking strategies, keeping healthy foods in the home, and planning for success.  Joe Huffman has agreed to follow-up with our clinic in 3 weeks. He was informed of the importance of frequent follow-up visits to maximize his success with intensive lifestyle modifications for his multiple health conditions.   Objective:   Blood pressure 123/80, pulse 88, temperature 98 F (36.7 C), height 6' (1.829 m), weight (!) 342 lb (155.1 kg), SpO2 96 %. Body mass index is 46.38 kg/m.  General: Cooperative, alert, well developed, in no acute distress. HEENT: Conjunctivae and lids unremarkable. Cardiovascular: Regular rhythm.  Lungs: Normal work of breathing. Neurologic: No focal deficits.   Lab Results  Component Value Date   CREATININE 0.86 03/23/2021   BUN 12 03/23/2021   NA 143 03/23/2021   K 4.4 03/23/2021   CL 108 (H) 03/23/2021   CO2 20 03/23/2021   Lab Results  Component Value Date   ALT 36 03/23/2021  AST 24 03/23/2021   ALKPHOS 70 03/23/2021   BILITOT 0.8 03/23/2021   Lab Results  Component Value Date   HGBA1C 5.7 (H) 03/23/2021   HGBA1C 5.7 (H) 04/27/2020   Lab Results  Component Value Date   INSULIN 36.0 (H) 03/23/2021   INSULIN 41.5 (H) 04/27/2020    Lab Results  Component Value Date   TSH 1.970 04/27/2020   Lab Results  Component Value Date   CHOL 112 03/23/2021   HDL 32 (L) 03/23/2021   LDLCALC 61 03/23/2021   TRIG 99 03/23/2021   Lab Results  Component Value Date   VD25OH 43.3 03/23/2021   VD25OH 23.5 (L) 04/27/2020   No results found for: WBC, HGB, HCT, MCV, PLT No results found for: IRON, TIBC, FERRITIN  Attestation Statements:   Reviewed by clinician on day of visit: allergies, medications, problem list, medical history, surgical history, family history, social history, and previous encounter notes.  I, Jackson Latino, RMA, am acting as Energy manager for [employer], DO.   I have reviewed the above documentation for accuracy and completeness, and I agree with the above. Corinna Capra, DO

## 2021-08-20 ENCOUNTER — Encounter (INDEPENDENT_AMBULATORY_CARE_PROVIDER_SITE_OTHER): Payer: Self-pay | Admitting: Bariatrics

## 2021-08-23 ENCOUNTER — Encounter (INDEPENDENT_AMBULATORY_CARE_PROVIDER_SITE_OTHER): Payer: Self-pay | Admitting: Bariatrics

## 2021-08-23 ENCOUNTER — Other Ambulatory Visit (INDEPENDENT_AMBULATORY_CARE_PROVIDER_SITE_OTHER): Payer: Self-pay | Admitting: Bariatrics

## 2021-08-23 NOTE — Telephone Encounter (Signed)
Please review

## 2021-08-23 NOTE — Telephone Encounter (Signed)
Brown ? ?

## 2021-08-25 ENCOUNTER — Other Ambulatory Visit (HOSPITAL_BASED_OUTPATIENT_CLINIC_OR_DEPARTMENT_OTHER): Payer: Self-pay

## 2021-08-25 ENCOUNTER — Encounter (INDEPENDENT_AMBULATORY_CARE_PROVIDER_SITE_OTHER): Payer: Self-pay | Admitting: Bariatrics

## 2021-08-25 MED ORDER — TIRZEPATIDE 5 MG/0.5ML ~~LOC~~ SOAJ
5.0000 mg | SUBCUTANEOUS | 0 refills | Status: DC
  Filled 2021-08-25: qty 2, 28d supply, fill #0

## 2021-09-13 ENCOUNTER — Ambulatory Visit (INDEPENDENT_AMBULATORY_CARE_PROVIDER_SITE_OTHER): Payer: No Typology Code available for payment source | Admitting: Bariatrics

## 2021-10-01 ENCOUNTER — Other Ambulatory Visit (HOSPITAL_BASED_OUTPATIENT_CLINIC_OR_DEPARTMENT_OTHER): Payer: Self-pay

## 2021-10-01 ENCOUNTER — Other Ambulatory Visit (INDEPENDENT_AMBULATORY_CARE_PROVIDER_SITE_OTHER): Payer: Self-pay | Admitting: Bariatrics

## 2021-10-04 ENCOUNTER — Other Ambulatory Visit (INDEPENDENT_AMBULATORY_CARE_PROVIDER_SITE_OTHER): Payer: Self-pay | Admitting: Bariatrics

## 2021-10-04 ENCOUNTER — Other Ambulatory Visit (HOSPITAL_BASED_OUTPATIENT_CLINIC_OR_DEPARTMENT_OTHER): Payer: Self-pay

## 2021-10-04 NOTE — Telephone Encounter (Signed)
Dr.Brown 

## 2021-10-04 NOTE — Telephone Encounter (Signed)
Pt last seen by Dr. Brown.  

## 2021-10-05 ENCOUNTER — Other Ambulatory Visit (HOSPITAL_BASED_OUTPATIENT_CLINIC_OR_DEPARTMENT_OTHER): Payer: Self-pay

## 2021-10-05 ENCOUNTER — Encounter (INDEPENDENT_AMBULATORY_CARE_PROVIDER_SITE_OTHER): Payer: Self-pay

## 2021-10-05 ENCOUNTER — Other Ambulatory Visit (INDEPENDENT_AMBULATORY_CARE_PROVIDER_SITE_OTHER): Payer: Self-pay | Admitting: Bariatrics

## 2021-10-05 MED ORDER — MOUNJARO 5 MG/0.5ML ~~LOC~~ SOAJ
5.0000 mg | SUBCUTANEOUS | 0 refills | Status: DC
  Filled 2021-10-05: qty 2, 28d supply, fill #0

## 2021-10-05 NOTE — Telephone Encounter (Signed)
LAST APPOINTMENT DATE: 08/19/21 NEXT APPOINTMENT DATE: 10/20/21   [facility] at [facility] [address] [place] [address] Phone: [phone] Fax: [phone]  Patient is requesting a refill of the following medications: Pending Prescriptions:                       Disp   Refills   tirzepatide (MOUNJARO) 5 MG/0.5ML Pen      2 mL   0       Sig: Inject 5 mg into the skin once a week.   Date last filled: 08/25/21 Previously prescribed by Dr Manson Passey  Lab Results      Component                Value               Date                      HGBA1C                   5.7 (H)             03/23/2021                HGBA1C                   5.7 (H)             04/27/2020           Lab Results      Component                Value               Date                      LDLCALC                  61                  03/23/2021                CREATININE               0.86                03/23/2021           Lab Results      Component                Value               Date                      VD25OH                   43.3                03/23/2021                VD25OH                   23.5 (L)            04/27/2020            BP Readings from Last 3 Encounters: 08/19/21 : 123/80 06/08/21 : (!) 143/78 05/11/21 : 128/70

## 2021-10-05 NOTE — Telephone Encounter (Signed)
LAST APPOINTMENT DATE: 08/19/21 NEXT APPOINTMENT DATE: 10/20/21   [facility] at [facility] [address] [place] [address] Phone: [phone] Fax: [phone]  Patient is requesting a refill of the following medications: Requested Prescriptions   Pending Prescriptions Disp Refills   tirzepatide (MOUNJARO) 5 MG/0.5ML Pen 2 mL 0    Sig: Inject 5 mg into the skin once a week.    Date last filled: 08/25/21 Previously prescribed by Dr Manson Passey  Lab Results  Component Value Date   HGBA1C 5.7 (H) 03/23/2021   HGBA1C 5.7 (H) 04/27/2020   Lab Results  Component Value Date   LDLCALC 61 03/23/2021   CREATININE 0.86 03/23/2021   Lab Results  Component Value Date   VD25OH 43.3 03/23/2021   VD25OH 23.5 (L) 04/27/2020    BP Readings from Last 3 Encounters:  08/19/21 123/80  06/08/21 (!) 143/78  05/11/21 128/70

## 2021-10-05 NOTE — Telephone Encounter (Signed)
LOV w/ Dr. Brown

## 2021-10-05 NOTE — Telephone Encounter (Signed)
Message sent to pt-CAS 

## 2021-10-06 ENCOUNTER — Other Ambulatory Visit (HOSPITAL_BASED_OUTPATIENT_CLINIC_OR_DEPARTMENT_OTHER): Payer: Self-pay

## 2021-10-20 ENCOUNTER — Telehealth (INDEPENDENT_AMBULATORY_CARE_PROVIDER_SITE_OTHER): Payer: No Typology Code available for payment source | Admitting: Bariatrics

## 2021-10-20 DIAGNOSIS — R7303 Prediabetes: Secondary | ICD-10-CM

## 2021-10-20 DIAGNOSIS — Z6841 Body Mass Index (BMI) 40.0 and over, adult: Secondary | ICD-10-CM | POA: Diagnosis not present

## 2021-10-20 DIAGNOSIS — I10 Essential (primary) hypertension: Secondary | ICD-10-CM | POA: Diagnosis not present

## 2021-10-21 ENCOUNTER — Encounter (INDEPENDENT_AMBULATORY_CARE_PROVIDER_SITE_OTHER): Payer: Self-pay | Admitting: Bariatrics

## 2021-10-21 NOTE — Progress Notes (Signed)
TeleHealth Visit:  Due to the COVID-19 pandemic, this visit was completed with telemedicine (audio/video) technology to reduce patient and provider exposure as well as to preserve personal protective equipment.   Joe Huffman has verbally consented to this TeleHealth visit. The patient is located at home, the provider is located at the [facility] office. The participants in this visit include the listed provider and patient. The visit was conducted today via video.  Chief Complaint: OBESITY Joe Huffman is here to discuss his progress with his obesity treatment plan along with follow-up of his obesity related diagnoses. Joe Huffman is on practicing portion control and making smarter food choices, such as increasing vegetables and decreasing simple carbohydrates and states he is following his eating plan approximately 0% of the time. [name] states he is doing 0 minutes 0 times per week.  Today's visit was #: 13 Starting weight: 360 lbs Starting date: 04/27/2020  Interim History: Joe Huffman states that he is sick today and requested a virtual visit. He states that his weight is the same. He wants to make slow progress.   Subjective:   1. Pre-diabetes Joe Huffman is currently taking Mounjaro. He denies changes in appetite.   2. Essential hypertension Joe Huffman is currently taking Bystolic.  Assessment/Plan:   1. Pre-diabetes Joe Huffman will continue his medications. He agrees to increase the [facility] at the next office visit. He will continue to work on weight loss, exercise, and decreasing simple carbohydrates to help decrease the risk of diabetes.   2. Essential hypertension Joe Huffman will continue his medications. He is working on healthy weight loss and exercise to improve blood pressure control. We will watch for signs of hypotension as he continues his lifestyle modifications.  3. Obesity with current BMI of 46.5 Joe Huffman is currently in the action stage of change. As  such, his goal is to continue with weight loss efforts. He has agreed to keeping a food journal and adhering to recommended goals of 1500-1600 calories and 100 grams of protein and practicing portion control and making smarter food choices, such as increasing vegetables and decreasing simple carbohydrates.   Joe Huffman will continue meal planning. He will stay at 1500-1600 calories and 100 grams of protein.   Exercise goals: No exercise has been prescribed at this time.  Behavioral modification strategies: increasing lean protein intake, decreasing simple carbohydrates, increasing vegetables, increasing water intake, decreasing eating out, no skipping meals, meal planning and cooking strategies, keeping healthy foods in the home, and planning for success.  Joe Huffman has agreed to follow-up with our clinic in 4 weeks. He was informed of the importance of frequent follow-up visits to maximize his success with intensive lifestyle modifications for his multiple health conditions.  Objective:   VITALS: Per patient if applicable, see vitals. GENERAL: Alert and in no acute distress. CARDIOPULMONARY: No increased WOB. Speaking in clear sentences.  PSYCH: Pleasant and cooperative. Speech normal rate and rhythm. Affect is appropriate. Insight and judgement are appropriate. Attention is focused, linear, and appropriate.  NEURO: Oriented as arrived to appointment on time with no prompting.   Lab Results  Component Value Date   CREATININE 0.86 03/23/2021   BUN 12 03/23/2021   NA 143 03/23/2021   K 4.4 03/23/2021   CL 108 (H) 03/23/2021   CO2 20 03/23/2021   Lab Results  Component Value Date   ALT 36 03/23/2021   AST 24 03/23/2021   ALKPHOS 70 03/23/2021   BILITOT 0.8 03/23/2021   Lab Results  Component Value Date  HGBA1C 5.7 (H) 03/23/2021   HGBA1C 5.7 (H) 04/27/2020   Lab Results  Component Value Date   INSULIN 36.0 (H) 03/23/2021   INSULIN 41.5 (H) 04/27/2020   Lab Results   Component Value Date   TSH 1.970 04/27/2020   Lab Results  Component Value Date   CHOL 112 03/23/2021   HDL 32 (L) 03/23/2021   LDLCALC 61 03/23/2021   TRIG 99 03/23/2021   Lab Results  Component Value Date   VD25OH 43.3 03/23/2021   VD25OH 23.5 (L) 04/27/2020   No results found for: WBC, HGB, HCT, MCV, PLT No results found for: IRON, TIBC, FERRITIN  Attestation Statements:   Reviewed by clinician on day of visit: allergies, medications, problem list, medical history, surgical history, family history, social history, and previous encounter notes.  I, Jackson Latino, RMA, am acting as Energy manager for [employer], DO.  I have reviewed the above documentation for accuracy and completeness, and I agree with the above. Corinna Capra, DO

## 2021-10-25 ENCOUNTER — Encounter (INDEPENDENT_AMBULATORY_CARE_PROVIDER_SITE_OTHER): Payer: Self-pay | Admitting: Bariatrics

## 2021-10-28 ENCOUNTER — Other Ambulatory Visit (HOSPITAL_BASED_OUTPATIENT_CLINIC_OR_DEPARTMENT_OTHER): Payer: Self-pay

## 2021-10-28 MED ORDER — AMOXICILLIN-POT CLAVULANATE 875-125 MG PO TABS
ORAL_TABLET | ORAL | 0 refills | Status: DC
  Filled 2021-10-28: qty 14, 7d supply, fill #0

## 2021-10-28 MED ORDER — GUAIFENESIN-CODEINE 100-10 MG/5ML PO SOLN
ORAL | 0 refills | Status: DC
  Filled 2021-10-28: qty 70, 7d supply, fill #0

## 2021-10-28 MED ORDER — BENZONATATE 100 MG PO CAPS
ORAL_CAPSULE | ORAL | 0 refills | Status: DC
  Filled 2021-10-28: qty 30, 10d supply, fill #0

## 2021-10-31 ENCOUNTER — Other Ambulatory Visit (INDEPENDENT_AMBULATORY_CARE_PROVIDER_SITE_OTHER): Payer: Self-pay | Admitting: Bariatrics

## 2021-11-01 ENCOUNTER — Encounter (HOSPITAL_BASED_OUTPATIENT_CLINIC_OR_DEPARTMENT_OTHER): Payer: Self-pay

## 2021-11-01 ENCOUNTER — Other Ambulatory Visit (HOSPITAL_BASED_OUTPATIENT_CLINIC_OR_DEPARTMENT_OTHER): Payer: Self-pay

## 2021-11-01 ENCOUNTER — Other Ambulatory Visit (INDEPENDENT_AMBULATORY_CARE_PROVIDER_SITE_OTHER): Payer: Self-pay | Admitting: Bariatrics

## 2021-11-01 MED ORDER — TIRZEPATIDE 7.5 MG/0.5ML ~~LOC~~ SOAJ
7.5000 mg | SUBCUTANEOUS | 0 refills | Status: DC
  Filled 2021-11-01: qty 2, 28d supply, fill #0

## 2021-11-01 NOTE — Telephone Encounter (Signed)
LAST APPOINTMENT DATE: 10/20/21 NEXT APPOINTMENT DATE: 11/17/21   [facility] at [facility] [address] [place] [address] Phone: [phone] Fax: [phone]  Patient is requesting a refill of the following medications: Pending Prescriptions:                       Disp   Refills   tirzepatide (MOUNJARO) 5 MG/0.5ML Pen      2 mL   0       Sig: Inject 5 mg into the skin once a week.   Date last filled: 10/05/21 Previously prescribed by Dr. Manson Passey  Lab Results      Component                Value               Date                      HGBA1C                   5.7 (H)             03/23/2021                HGBA1C                   5.7 (H)             04/27/2020           Lab Results      Component                Value               Date                      LDLCALC                  61                  03/23/2021                CREATININE               0.86                03/23/2021           Lab Results      Component                Value               Date                      VD25OH                   43.3                03/23/2021                VD25OH                   23.5 (L)            04/27/2020            BP Readings from Last 3 Encounters: 08/19/21 : 123/80 06/08/21 : (!) 143/78 05/11/21 : 128/70

## 2021-11-02 ENCOUNTER — Other Ambulatory Visit (HOSPITAL_BASED_OUTPATIENT_CLINIC_OR_DEPARTMENT_OTHER): Payer: Self-pay

## 2021-11-02 ENCOUNTER — Encounter (HOSPITAL_BASED_OUTPATIENT_CLINIC_OR_DEPARTMENT_OTHER): Payer: Self-pay

## 2021-11-02 ENCOUNTER — Encounter (INDEPENDENT_AMBULATORY_CARE_PROVIDER_SITE_OTHER): Payer: Self-pay | Admitting: Bariatrics

## 2021-11-08 NOTE — Telephone Encounter (Signed)
Last OV with Dr Brown 

## 2021-11-08 NOTE — Telephone Encounter (Signed)
Please review

## 2021-11-09 ENCOUNTER — Other Ambulatory Visit (INDEPENDENT_AMBULATORY_CARE_PROVIDER_SITE_OTHER): Payer: Self-pay | Admitting: Bariatrics

## 2021-11-09 ENCOUNTER — Other Ambulatory Visit (HOSPITAL_BASED_OUTPATIENT_CLINIC_OR_DEPARTMENT_OTHER): Payer: Self-pay

## 2021-11-09 MED ORDER — WEGOVY 0.5 MG/0.5ML ~~LOC~~ SOAJ
0.5000 mg | SUBCUTANEOUS | 0 refills | Status: DC
  Filled 2021-11-09: qty 2, 28d supply, fill #0

## 2021-11-09 NOTE — Telephone Encounter (Signed)
Please review

## 2021-11-10 ENCOUNTER — Other Ambulatory Visit (HOSPITAL_COMMUNITY): Payer: Self-pay

## 2021-11-10 ENCOUNTER — Other Ambulatory Visit (HOSPITAL_BASED_OUTPATIENT_CLINIC_OR_DEPARTMENT_OTHER): Payer: Self-pay

## 2021-11-11 ENCOUNTER — Other Ambulatory Visit (HOSPITAL_BASED_OUTPATIENT_CLINIC_OR_DEPARTMENT_OTHER): Payer: Self-pay

## 2021-11-11 NOTE — Telephone Encounter (Signed)
Left message for patient to call me

## 2021-11-17 ENCOUNTER — Other Ambulatory Visit (HOSPITAL_BASED_OUTPATIENT_CLINIC_OR_DEPARTMENT_OTHER): Payer: Self-pay

## 2021-11-17 ENCOUNTER — Other Ambulatory Visit: Payer: Self-pay

## 2021-11-17 ENCOUNTER — Ambulatory Visit (INDEPENDENT_AMBULATORY_CARE_PROVIDER_SITE_OTHER): Payer: No Typology Code available for payment source | Admitting: Bariatrics

## 2021-11-17 ENCOUNTER — Encounter (INDEPENDENT_AMBULATORY_CARE_PROVIDER_SITE_OTHER): Payer: Self-pay | Admitting: Bariatrics

## 2021-11-17 VITALS — BP 146/85 | HR 86 | Temp 98.6°F | Ht 72.0 in | Wt 352.0 lb

## 2021-11-17 DIAGNOSIS — R7303 Prediabetes: Secondary | ICD-10-CM | POA: Diagnosis not present

## 2021-11-17 DIAGNOSIS — R632 Polyphagia: Secondary | ICD-10-CM | POA: Diagnosis not present

## 2021-11-17 DIAGNOSIS — J9801 Acute bronchospasm: Secondary | ICD-10-CM

## 2021-11-17 DIAGNOSIS — E669 Obesity, unspecified: Secondary | ICD-10-CM

## 2021-11-17 DIAGNOSIS — Z6841 Body Mass Index (BMI) 40.0 and over, adult: Secondary | ICD-10-CM

## 2021-11-17 MED ORDER — FLUTICASONE-SALMETEROL 100-50 MCG/ACT IN AEPB
1.0000 | INHALATION_SPRAY | Freq: Two times a day (BID) | RESPIRATORY_TRACT | 0 refills | Status: AC
  Filled 2021-11-17: qty 60, 30d supply, fill #0

## 2021-11-17 NOTE — Progress Notes (Signed)
Kidney stone     Chief Complaint:   OBESITY Joe Huffman is here to discuss his progress with his obesity treatment plan along with follow-up of his obesity related diagnoses. Joe Huffman is on the Category 3 Plan and keeping a food journal and adhering to recommended goals of 1500 calories and 90-110 grams of protein and states he is following his eating plan approximately 0% of the time. Joe Huffman states he is doing 0 minutes 0 times per week.  Today's visit was #: 14 Starting weight: 360 lbs Starting date: 04/27/2020 Today's weight: 352 lbs Today's date: 11/17/2021 Total lbs lost to date: 8 lbs Total lbs lost since last in-office visit: 0  Interim History: Joe Huffman is up 10 lbs since his last visit. We prescribed Wegovy (weight loss) but his plan excluded the medication. We tried Joe Huffman (excluded) and he has actively used Rybelsus.   Subjective:   1. Pre-diabetes Joe Huffman is currently not on medications. He notes increased appetite. He denies kidney stones. He does not have a history of renal calculi, or seizures.   2. Polyphagia Joe Huffman has tried Rybelsus. He has increased appetite. Excluded Joe Huffman and Joe Huffman).  3. Cough due to bronchospasm Joe Huffman notes a cough.  Assessment/Plan:   1. Pre-diabetes Joe Huffman will continue to decrease carbohydrates. He will increase activities. We discussed Saxenda. He will continue  to decrease carbohydrates.   2. Polyphagia Joe Huffman will decrease the carbohydrates. He will increase protein.Intensive lifestyle modifications are the first line treatment for this issue. We discussed several lifestyle modifications today and he will continue to work on diet, exercise and weight loss efforts. Orders and follow up as documented in patient record.  Counseling Polyphagia is excessive hunger. Causes can include: low blood sugars, hypERthyroidism, PMS, lack of sleep, stress, insulin resistance, diabetes, certain medications, and diets that are  deficient in protein and fiber.    3. Cough due to bronchospasm We will refill fluticasone/salmeterol (Advair) with no refills.  - fluticasone-salmeterol (ADVAIR) 100-50 MCG/ACT AEPB; Inhale 1 puff into the lungs 2 (two) times daily.  Dispense: 60 each; Refill: 0  4. Obesity with current BMI of 47.5 Joe Huffman is currently in the action stage of change. As such, his goal is to continue with weight loss efforts. He has agreed to the Category 3 Plan and keeping a food journal and adhering to recommended goals of 1500 calories and 90-110 grams of protein.   Exercise goals: No exercise has been prescribed at this time.  Behavioral modification strategies: increasing lean protein intake, decreasing simple carbohydrates, increasing vegetables, increasing water intake, decreasing eating out, no skipping meals, meal planning and cooking strategies, keeping healthy foods in the home, and planning for success.  Joe Huffman has agreed to follow-up with our clinic in 3-4 weeks. He was informed of the importance of frequent follow-up visits to maximize his success with intensive lifestyle modifications for his multiple health conditions.   Objective:   Blood pressure (!) 146/85, pulse 86, temperature 98.6 F (37 C), height 6' (1.829 m), weight (!) 352 lb (159.7 kg), SpO2 95 %. Body mass index is 47.74 kg/m.  General: Cooperative, alert, well developed, in no acute distress. HEENT: Conjunctivae and lids unremarkable. Cardiovascular: Regular rhythm.  Lungs: Normal work of breathing. Neurologic: No focal deficits.   Lab Results  Component Value Date   CREATININE 0.86 03/23/2021   BUN 12 03/23/2021   NA 143 03/23/2021   K 4.4 03/23/2021   CL 108 (H) 03/23/2021   CO2 20 03/23/2021   Lab Results  Component Value Date   ALT 36 03/23/2021   AST 24 03/23/2021   ALKPHOS 70 03/23/2021   BILITOT 0.8 03/23/2021   Lab Results  Component Value Date   HGBA1C 5.7 (H) 03/23/2021   HGBA1C 5.7 (H)  04/27/2020   Lab Results  Component Value Date   INSULIN 36.0 (H) 03/23/2021   INSULIN 41.5 (H) 04/27/2020   Lab Results  Component Value Date   TSH 1.970 04/27/2020   Lab Results  Component Value Date   CHOL 112 03/23/2021   HDL 32 (L) 03/23/2021   LDLCALC 61 03/23/2021   TRIG 99 03/23/2021   Lab Results  Component Value Date   VD25OH 43.3 03/23/2021   VD25OH 23.5 (L) 04/27/2020   No results found for: WBC, HGB, HCT, MCV, PLT No results found for: IRON, TIBC, FERRITIN  Attestation Statements:   Reviewed by clinician on day of visit: allergies, medications, problem list, medical history, surgical history, family history, social history, and previous encounter notes.  I, Joe Huffman, RMA, am acting as Energy manager for [employer], DO.  I have reviewed the above documentation for accuracy and completeness, and I agree with the above. Corinna Capra, DO

## 2021-11-18 ENCOUNTER — Encounter (INDEPENDENT_AMBULATORY_CARE_PROVIDER_SITE_OTHER): Payer: Self-pay | Admitting: Bariatrics

## 2021-11-24 ENCOUNTER — Other Ambulatory Visit (HOSPITAL_BASED_OUTPATIENT_CLINIC_OR_DEPARTMENT_OTHER): Payer: Self-pay

## 2021-12-02 ENCOUNTER — Other Ambulatory Visit (INDEPENDENT_AMBULATORY_CARE_PROVIDER_SITE_OTHER): Payer: Self-pay | Admitting: Bariatrics

## 2021-12-02 DIAGNOSIS — I10 Essential (primary) hypertension: Secondary | ICD-10-CM

## 2021-12-02 NOTE — Telephone Encounter (Signed)
Pt last seen by Dr. Brown.  

## 2021-12-10 ENCOUNTER — Other Ambulatory Visit (HOSPITAL_BASED_OUTPATIENT_CLINIC_OR_DEPARTMENT_OTHER): Payer: Self-pay

## 2021-12-10 MED ORDER — NEBIVOLOL HCL 5 MG PO TABS
ORAL_TABLET | ORAL | 0 refills | Status: DC
  Filled 2021-12-10: qty 90, 90d supply, fill #0

## 2021-12-13 ENCOUNTER — Other Ambulatory Visit (HOSPITAL_BASED_OUTPATIENT_CLINIC_OR_DEPARTMENT_OTHER): Payer: Self-pay

## 2021-12-13 ENCOUNTER — Other Ambulatory Visit (INDEPENDENT_AMBULATORY_CARE_PROVIDER_SITE_OTHER): Payer: Self-pay | Admitting: Bariatrics

## 2021-12-13 DIAGNOSIS — J9801 Acute bronchospasm: Secondary | ICD-10-CM

## 2021-12-13 NOTE — Telephone Encounter (Signed)
Dr.Brown 

## 2021-12-14 ENCOUNTER — Encounter (HOSPITAL_BASED_OUTPATIENT_CLINIC_OR_DEPARTMENT_OTHER): Payer: Self-pay | Admitting: Pharmacist

## 2021-12-14 ENCOUNTER — Other Ambulatory Visit (HOSPITAL_BASED_OUTPATIENT_CLINIC_OR_DEPARTMENT_OTHER): Payer: Self-pay

## 2021-12-15 ENCOUNTER — Ambulatory Visit (INDEPENDENT_AMBULATORY_CARE_PROVIDER_SITE_OTHER): Payer: No Typology Code available for payment source | Admitting: Bariatrics

## 2021-12-15 ENCOUNTER — Other Ambulatory Visit: Payer: Self-pay

## 2021-12-15 ENCOUNTER — Encounter (INDEPENDENT_AMBULATORY_CARE_PROVIDER_SITE_OTHER): Payer: Self-pay | Admitting: Bariatrics

## 2021-12-15 VITALS — BP 142/87 | HR 85 | Temp 99.1°F | Ht 72.0 in | Wt 354.0 lb

## 2021-12-15 DIAGNOSIS — Z6841 Body Mass Index (BMI) 40.0 and over, adult: Secondary | ICD-10-CM

## 2021-12-15 DIAGNOSIS — R632 Polyphagia: Secondary | ICD-10-CM | POA: Diagnosis not present

## 2021-12-15 DIAGNOSIS — E669 Obesity, unspecified: Secondary | ICD-10-CM | POA: Diagnosis not present

## 2021-12-15 DIAGNOSIS — I10 Essential (primary) hypertension: Secondary | ICD-10-CM | POA: Diagnosis not present

## 2021-12-15 DIAGNOSIS — R7303 Prediabetes: Secondary | ICD-10-CM | POA: Diagnosis not present

## 2021-12-15 NOTE — Progress Notes (Signed)
? ? ? ?Chief Complaint:  ? ?OBESITY ?Joe Huffman is here to discuss his progress with his obesity treatment plan along with follow-up of his obesity related diagnoses. [name] is on practicing portion control and making smarter food choices, such as increasing vegetables and decreasing simple carbohydrates and states he is following his eating plan approximately 0% of the time. [name] states he is doing 0 minutes 0 times per week. ? ?Today's visit was #: 15 ?Starting weight: 360 lbs ?Starting date: 04/27/2020 ?Today's weight: 354 lbs ?Today's date: 12/15/2021 ?Total lbs lost to date: 6 lbs ?Total lbs lost since last in-office visit: 0 ? ?Interim History: [name] is up 2 lbs but had to go to [place] due to the death of his father. He has been doing some stress eating.  ? ?Subjective:  ? ?1. Pre-diabetes ?[name] is currently taking Wegovy. He has a supply at home. ? ?2. Polyphagia ?[name] is currently taking Wegovy. He has a supply at home.  ? ?3. Essential hypertension ?[name] is currently taking Bystolic. ? ?Assessment/Plan:  ? ?1. Pre-diabetes ?[name] will continue taking Wegovy. He will keep starches and sugars low. She will continue to work on weight loss, exercise, and decreasing simple carbohydrates to help decrease the risk of diabetes.  ? ?2. Polyphagia ?[name] will continue taking Wegovy.  ? ?3. Essential hypertension ?[name] will continue taking Bystolic. He is working on healthy weight loss and exercise to improve blood pressure control. We will watch for signs of hypotension as he continues his lifestyle modifications. ? ?4. Obesity with current BMI of 48.1 ?[name] is currently in the action stage of change. As such, his goal is to continue with weight loss efforts. He has agreed to practicing portion control and making smarter food choices, such as increasing vegetables and decreasing simple carbohydrates.  ? ?[name] will adhere more closely to his plan 80-90%. ? ?Exercise  goals: No exercise has been prescribed at this time. ? ?Behavioral modification strategies: increasing lean protein intake, decreasing simple carbohydrates, increasing vegetables, increasing water intake, decreasing eating out, no skipping meals, meal planning and cooking strategies, keeping healthy foods in the home, and planning for success. ? ?[name] has agreed to follow-up with our clinic in 3-4 weeks. He was informed of the importance of frequent follow-up visits to maximize his success with intensive lifestyle modifications for his multiple health conditions.  ? ?Objective:  ? ?Pulse 85, temperature 99.1 ?F (37.3 ?C), height 6' (1.829 m), weight (!) 354 lb (160.6 kg), SpO2 97 %. ?Body mass index is 48.01 kg/m?. ? ?General: Cooperative, alert, well developed, in no acute distress. ?HEENT: Conjunctivae and lids unremarkable. ?Cardiovascular: Regular rhythm.  ?Lungs: Normal work of breathing. ?Neurologic: No focal deficits.  ? ?Lab Results  ?Component Value Date  ? CREATININE 0.86 03/23/2021  ? BUN 12 03/23/2021  ? NA 143 03/23/2021  ? K 4.4 03/23/2021  ? CL 108 (H) 03/23/2021  ? CO2 20 03/23/2021  ? ?Lab Results  ?Component Value Date  ? ALT 36 03/23/2021  ? AST 24 03/23/2021  ? ALKPHOS 70 03/23/2021  ? BILITOT 0.8 03/23/2021  ? ?Lab Results  ?Component Value Date  ? HGBA1C 5.7 (H) 03/23/2021  ? HGBA1C 5.7 (H) 04/27/2020  ? ?Lab Results  ?Component Value Date  ? INSULIN 36.0 (H) 03/23/2021  ? INSULIN 41.5 (H) 04/27/2020  ? ?Lab Results  ?Component Value Date  ? TSH 1.970 04/27/2020  ? ?Lab Results  ?Component Value Date  ? CHOL 112 03/23/2021  ? HDL 32 (L)  03/23/2021  ? LDLCALC 61 03/23/2021  ? TRIG 99 03/23/2021  ? ?Lab Results  ?Component Value Date  ? VD25OH 43.3 03/23/2021  ? VD25OH 23.5 (L) 04/27/2020  ? ?No results found for: WBC, HGB, HCT, MCV, PLT ?No results found for: IRON, TIBC, FERRITIN ? ?Attestation Statements:  ? ?Reviewed by clinician on day of visit: allergies, medications, problem list,  medical history, surgical history, family history, social history, and previous encounter notes. ? ?I, Jackson Latino, RMA, am acting as transcriptionist for [employer], DO. ? ?I have reviewed the above documentation for accuracy and completeness, and I agree with the above. Corinna Capra, DO ? ?

## 2021-12-18 ENCOUNTER — Encounter (INDEPENDENT_AMBULATORY_CARE_PROVIDER_SITE_OTHER): Payer: Self-pay | Admitting: Bariatrics

## 2022-01-04 ENCOUNTER — Other Ambulatory Visit: Payer: Self-pay | Admitting: Family Medicine

## 2022-01-04 DIAGNOSIS — I10 Essential (primary) hypertension: Secondary | ICD-10-CM

## 2022-01-12 ENCOUNTER — Other Ambulatory Visit (INDEPENDENT_AMBULATORY_CARE_PROVIDER_SITE_OTHER): Payer: Self-pay | Admitting: Bariatrics

## 2022-01-12 ENCOUNTER — Telehealth (INDEPENDENT_AMBULATORY_CARE_PROVIDER_SITE_OTHER): Payer: Self-pay | Admitting: Bariatrics

## 2022-01-12 ENCOUNTER — Ambulatory Visit (INDEPENDENT_AMBULATORY_CARE_PROVIDER_SITE_OTHER): Payer: No Typology Code available for payment source | Admitting: Bariatrics

## 2022-01-12 ENCOUNTER — Other Ambulatory Visit (HOSPITAL_BASED_OUTPATIENT_CLINIC_OR_DEPARTMENT_OTHER): Payer: Self-pay

## 2022-01-12 ENCOUNTER — Encounter (HOSPITAL_BASED_OUTPATIENT_CLINIC_OR_DEPARTMENT_OTHER): Payer: Self-pay

## 2022-01-12 ENCOUNTER — Encounter (INDEPENDENT_AMBULATORY_CARE_PROVIDER_SITE_OTHER): Payer: Self-pay | Admitting: Bariatrics

## 2022-01-12 DIAGNOSIS — R7303 Prediabetes: Secondary | ICD-10-CM

## 2022-01-12 DIAGNOSIS — I10 Essential (primary) hypertension: Secondary | ICD-10-CM

## 2022-01-12 MED ORDER — WEGOVY 1 MG/0.5ML ~~LOC~~ SOAJ
1.0000 mg | SUBCUTANEOUS | 0 refills | Status: AC
  Filled 2022-01-12 – 2022-03-10 (×2): qty 2, 28d supply, fill #0

## 2022-01-12 NOTE — Telephone Encounter (Signed)
Pt came in late and Montey Hora took this message

## 2022-01-12 NOTE — Telephone Encounter (Signed)
Patient wants a Joe Huffman refill now that his insurance will cover it. Patient also wants to increase the [facility] dosage. Please call pt at [phone]. Thank You! ?

## 2022-01-12 NOTE — Telephone Encounter (Signed)
Please review

## 2022-01-13 ENCOUNTER — Other Ambulatory Visit (INDEPENDENT_AMBULATORY_CARE_PROVIDER_SITE_OTHER): Payer: Self-pay | Admitting: Bariatrics

## 2022-01-13 ENCOUNTER — Other Ambulatory Visit (HOSPITAL_BASED_OUTPATIENT_CLINIC_OR_DEPARTMENT_OTHER): Payer: Self-pay

## 2022-01-13 MED ORDER — WEGOVY 0.5 MG/0.5ML ~~LOC~~ SOAJ
0.5000 mg | SUBCUTANEOUS | 0 refills | Status: DC
  Filled 2022-01-13 – 2022-01-25 (×2): qty 2, 28d supply, fill #0

## 2022-01-14 ENCOUNTER — Other Ambulatory Visit (HOSPITAL_BASED_OUTPATIENT_CLINIC_OR_DEPARTMENT_OTHER): Payer: Self-pay

## 2022-01-18 ENCOUNTER — Other Ambulatory Visit (HOSPITAL_BASED_OUTPATIENT_CLINIC_OR_DEPARTMENT_OTHER): Payer: Self-pay

## 2022-01-19 ENCOUNTER — Telehealth (INDEPENDENT_AMBULATORY_CARE_PROVIDER_SITE_OTHER): Payer: Self-pay | Admitting: Bariatrics

## 2022-01-19 ENCOUNTER — Other Ambulatory Visit (HOSPITAL_BASED_OUTPATIENT_CLINIC_OR_DEPARTMENT_OTHER): Payer: Self-pay

## 2022-01-19 ENCOUNTER — Encounter (INDEPENDENT_AMBULATORY_CARE_PROVIDER_SITE_OTHER): Payer: Self-pay

## 2022-01-19 ENCOUNTER — Encounter (HOSPITAL_BASED_OUTPATIENT_CLINIC_OR_DEPARTMENT_OTHER): Payer: Self-pay

## 2022-01-19 NOTE — Telephone Encounter (Signed)
Prior authorization denied for Wegovy. Per insurance: not a covered benefit/plan exclusion. Patient sent denial message via mychart.  ?

## 2022-01-25 ENCOUNTER — Other Ambulatory Visit (HOSPITAL_BASED_OUTPATIENT_CLINIC_OR_DEPARTMENT_OTHER): Payer: Self-pay

## 2022-02-02 ENCOUNTER — Other Ambulatory Visit (HOSPITAL_BASED_OUTPATIENT_CLINIC_OR_DEPARTMENT_OTHER): Payer: Self-pay

## 2022-02-07 ENCOUNTER — Other Ambulatory Visit (HOSPITAL_BASED_OUTPATIENT_CLINIC_OR_DEPARTMENT_OTHER): Payer: Self-pay

## 2022-02-07 ENCOUNTER — Encounter (INDEPENDENT_AMBULATORY_CARE_PROVIDER_SITE_OTHER): Payer: Self-pay | Admitting: Bariatrics

## 2022-02-07 ENCOUNTER — Ambulatory Visit (INDEPENDENT_AMBULATORY_CARE_PROVIDER_SITE_OTHER): Payer: No Typology Code available for payment source | Admitting: Bariatrics

## 2022-02-07 ENCOUNTER — Encounter (HOSPITAL_BASED_OUTPATIENT_CLINIC_OR_DEPARTMENT_OTHER): Payer: Self-pay

## 2022-02-07 VITALS — BP 155/88 | HR 85 | Temp 98.1°F | Ht 72.0 in | Wt 355.0 lb

## 2022-02-07 DIAGNOSIS — R7303 Prediabetes: Secondary | ICD-10-CM

## 2022-02-07 DIAGNOSIS — I10 Essential (primary) hypertension: Secondary | ICD-10-CM

## 2022-02-07 DIAGNOSIS — Z6841 Body Mass Index (BMI) 40.0 and over, adult: Secondary | ICD-10-CM

## 2022-02-07 DIAGNOSIS — E669 Obesity, unspecified: Secondary | ICD-10-CM

## 2022-02-07 DIAGNOSIS — R632 Polyphagia: Secondary | ICD-10-CM

## 2022-02-07 MED ORDER — NEBIVOLOL HCL 5 MG PO TABS
ORAL_TABLET | ORAL | 0 refills | Status: AC
  Filled 2022-02-07: qty 90, fill #0
  Filled 2022-02-22: qty 90, 90d supply, fill #0

## 2022-02-07 MED ORDER — RYBELSUS 7 MG PO TABS
7.0000 mg | ORAL_TABLET | Freq: Every day | ORAL | 1 refills | Status: DC
  Filled 2022-02-07: qty 30, 30d supply, fill #0

## 2022-02-08 ENCOUNTER — Telehealth (INDEPENDENT_AMBULATORY_CARE_PROVIDER_SITE_OTHER): Payer: Self-pay | Admitting: Bariatrics

## 2022-02-08 ENCOUNTER — Other Ambulatory Visit (HOSPITAL_BASED_OUTPATIENT_CLINIC_OR_DEPARTMENT_OTHER): Payer: Self-pay

## 2022-02-08 ENCOUNTER — Encounter (INDEPENDENT_AMBULATORY_CARE_PROVIDER_SITE_OTHER): Payer: Self-pay

## 2022-02-08 ENCOUNTER — Encounter (HOSPITAL_BASED_OUTPATIENT_CLINIC_OR_DEPARTMENT_OTHER): Payer: Self-pay

## 2022-02-08 NOTE — Telephone Encounter (Signed)
Prior authorization denied for Rybelsus. Per insurance:Rybelsus is not FDA approved or supported by an accepted medical resource for the treatment of your medical condition(s): A condition where your blood glucose levels are higher than normal, but not high enough for you to have diabetes (Prediabetes). ? ?Patient sent denial message via mychart. ? ?

## 2022-02-14 NOTE — Progress Notes (Signed)
? ? ? ?Chief Complaint:  ? ?OBESITY ?Joe Huffman is here to discuss his progress with his obesity treatment plan along with follow-up of his obesity related diagnoses. Kvion is on practicing portion control and making smarter food choices, such as increasing vegetables and decreasing simple carbohydrates and states he is following his eating plan approximately 0% of the time. [name] states he is doing 0 minutes 0 times per week. ? ?Today's visit was #: 16 ?Starting weight: 360 lbs ?Starting date: 04/27/2020 ?Today's weight: 355 lbs ?Today's date: 02/07/2022 ?Total lbs lost to date: 5 lbs  ?Total lbs lost since last in-office visit: 0 lbs ? ?Interim History: [name] is up 1 lbs since his last visit. ? ?Subjective:  ? ?1. Primary hypertension ?[name]'s blood pressure was slightly elevated today.   ? ?2. Prediabetes ?[name] reports that his [name] has been denied. He has taken [facility] and Rybelsus in the past. ? ?3. Polyphagia ?Saxenda, GLP's all denied.  [name] did take Monjauro and Rybelsus in the  past with good results. ? ?Assessment/Plan:  ? ?1. Primary hypertension ?Emiterio is currently in the action stage of change. As such, his goal is to continue with weight loss efforts. He has agreed to practicing portion control and making smarter food choices, such as increasing vegetables and decreasing simple carbohydrates. We will refill Bystolic. ?- nebivolol (BYSTOLIC) 5 MG tablet; 1 tablet Orally Once a day 90 days  Dispense: 90 tablet; Refill: 0 ? ?2. Prediabetes ?Kshaun will minimize carbohydrates (sweets & starches).  She will continue to work on weight loss, exercise, and decreasing simple carbohydrates to help decrease the risk of diabetes.  ?  ?3. Polyphagia ?[name] will start Eybelsus 7 mg daily. ?- Semaglutide (RYBELSUS) 7 MG TABS; Take 7 mg by mouth daily.  Dispense: 30 tablet; Refill: 1 ? ?4. Obesity, current BMI 48.3 ?[name] is currently in the action stage of change. As such,  his goal is to continue with weight loss efforts. He has agreed to practicing portion control and making smarter food choices, such as increasing vegetables and decreasing simple carbohydrates.  ? ?[name] will continue to to watch portion sizes and make good choices. ? ?Exercise goals: No exercise has been prescribed at this time. ? ?Behavioral modification strategies: increasing lean protein intake, decreasing simple carbohydrates, increasing vegetables, increasing water intake, decreasing eating out, no skipping meals, meal planning and cooking strategies, keeping healthy foods in the home, planning for success, and keeping a strict food journal. ? ?[name] has agreed to follow-up with Dr. Juleen China at [place] in 6 weeks. He was informed of the importance of frequent follow-up visits to maximize his success with intensive lifestyle modifications for his multiple health conditions.  ? ?Objective:  ? ?Blood pressure (!) 155/88, pulse 85, temperature 98.1 ?F (36.7 ?C), height 6' (1.829 m), weight (!) 355 lb (161 kg), SpO2 96 %. ?Body mass index is 48.15 kg/m?. ? ?General: Cooperative, alert, well developed, in no acute distress. ?HEENT: Conjunctivae and lids unremarkable. ?Cardiovascular: Regular rhythm.  ?Lungs: Normal work of breathing. ?Neurologic: No focal deficits.  ? ?Lab Results  ?Component Value Date  ? CREATININE 0.86 03/23/2021  ? BUN 12 03/23/2021  ? NA 143 03/23/2021  ? K 4.4 03/23/2021  ? CL 108 (H) 03/23/2021  ? CO2 20 03/23/2021  ? ?Lab Results  ?Component Value Date  ? ALT 36 03/23/2021  ? AST 24 03/23/2021  ? ALKPHOS 70 03/23/2021  ? BILITOT 0.8 03/23/2021  ? ?Lab Results  ?Component Value Date  ?  HGBA1C 5.7 (H) 03/23/2021  ? HGBA1C 5.7 (H) 04/27/2020  ? ?Lab Results  ?Component Value Date  ? INSULIN 36.0 (H) 03/23/2021  ? INSULIN 41.5 (H) 04/27/2020  ? ?Lab Results  ?Component Value Date  ? TSH 1.970 04/27/2020  ? ?Lab Results  ?Component Value Date  ? CHOL 112 03/23/2021  ? HDL 32 (L) 03/23/2021   ? [place] 61 03/23/2021  ? TRIG 99 03/23/2021  ? ?Lab Results  ?Component Value Date  ? VD25OH 43.3 03/23/2021  ? VD25OH 23.5 (L) 04/27/2020  ? ?No results found for: WBC, HGB, HCT, MCV, PLT ?No results found for: IRON, TIBC, FERRITIN ? ? ?Attestation Statements:  ? ?Reviewed by clinician on day of visit: allergies, medications, problem list, medical history, surgical history, family history, social history, and previous encounter notes. ? ?I, Lennette Bihari, CMA, am acting as transcriptionist for Dr. Jearld Lesch, DO. ? ?I have reviewed the above documentation for accuracy and completeness, and I agree with the above. Jearld Lesch, DO ? ?

## 2022-02-15 ENCOUNTER — Other Ambulatory Visit (HOSPITAL_BASED_OUTPATIENT_CLINIC_OR_DEPARTMENT_OTHER): Payer: Self-pay

## 2022-02-16 ENCOUNTER — Encounter (INDEPENDENT_AMBULATORY_CARE_PROVIDER_SITE_OTHER): Payer: Self-pay | Admitting: Bariatrics

## 2022-02-16 DIAGNOSIS — Z860100 Personal history of colon polyps, unspecified: Secondary | ICD-10-CM | POA: Insufficient documentation

## 2022-02-16 DIAGNOSIS — K5732 Diverticulitis of large intestine without perforation or abscess without bleeding: Secondary | ICD-10-CM | POA: Insufficient documentation

## 2022-02-22 ENCOUNTER — Other Ambulatory Visit (HOSPITAL_BASED_OUTPATIENT_CLINIC_OR_DEPARTMENT_OTHER): Payer: Self-pay

## 2022-02-22 ENCOUNTER — Ambulatory Visit (HOSPITAL_BASED_OUTPATIENT_CLINIC_OR_DEPARTMENT_OTHER)
Admission: RE | Admit: 2022-02-22 | Discharge: 2022-02-22 | Disposition: A | Discharge status: Home / Self Care | Payer: Self-pay | Source: Ambulatory Visit | Attending: Family Medicine | Admitting: Family Medicine

## 2022-02-22 DIAGNOSIS — I10 Essential (primary) hypertension: Secondary | ICD-10-CM | POA: Insufficient documentation

## 2022-02-25 ENCOUNTER — Encounter (INDEPENDENT_AMBULATORY_CARE_PROVIDER_SITE_OTHER): Payer: Self-pay | Admitting: Bariatrics

## 2022-03-10 ENCOUNTER — Other Ambulatory Visit (HOSPITAL_BASED_OUTPATIENT_CLINIC_OR_DEPARTMENT_OTHER): Payer: Self-pay

## 2022-03-15 ENCOUNTER — Other Ambulatory Visit (HOSPITAL_BASED_OUTPATIENT_CLINIC_OR_DEPARTMENT_OTHER): Payer: Self-pay

## 2022-04-12 ENCOUNTER — Other Ambulatory Visit (HOSPITAL_BASED_OUTPATIENT_CLINIC_OR_DEPARTMENT_OTHER): Payer: Self-pay

## 2022-04-12 MED ORDER — PAXLOVID (300/100) 20 X 150 MG & 10 X 100MG PO TBPK
ORAL_TABLET | ORAL | 0 refills | Status: DC
  Filled 2022-04-12: qty 30, 5d supply, fill #0

## 2022-05-25 ENCOUNTER — Encounter (INDEPENDENT_AMBULATORY_CARE_PROVIDER_SITE_OTHER): Payer: Self-pay

## 2022-06-09 ENCOUNTER — Other Ambulatory Visit (HOSPITAL_BASED_OUTPATIENT_CLINIC_OR_DEPARTMENT_OTHER): Payer: Self-pay

## 2022-06-09 MED ORDER — NEBIVOLOL HCL 5 MG PO TABS
ORAL_TABLET | ORAL | 1 refills | Status: DC
  Filled 2022-06-09: qty 90, 90d supply, fill #0
  Filled 2022-09-02: qty 90, 90d supply, fill #1

## 2022-06-30 ENCOUNTER — Other Ambulatory Visit (HOSPITAL_BASED_OUTPATIENT_CLINIC_OR_DEPARTMENT_OTHER): Payer: Self-pay

## 2022-06-30 MED ORDER — AMOXICILLIN-POT CLAVULANATE 875-125 MG PO TABS
1.0000 | ORAL_TABLET | Freq: Two times a day (BID) | ORAL | 0 refills | Status: DC
  Filled 2022-06-30: qty 20, 10d supply, fill #0

## 2022-07-07 ENCOUNTER — Other Ambulatory Visit (HOSPITAL_BASED_OUTPATIENT_CLINIC_OR_DEPARTMENT_OTHER): Payer: Self-pay

## 2022-07-07 ENCOUNTER — Other Ambulatory Visit: Payer: Self-pay | Admitting: Family Medicine

## 2022-07-07 ENCOUNTER — Ambulatory Visit (HOSPITAL_BASED_OUTPATIENT_CLINIC_OR_DEPARTMENT_OTHER)
Admission: RE | Admit: 2022-07-07 | Discharge: 2022-07-07 | Disposition: A | Discharge status: Home / Self Care | Payer: No Typology Code available for payment source | Source: Ambulatory Visit | Attending: Family Medicine | Admitting: Family Medicine

## 2022-07-07 ENCOUNTER — Encounter (HOSPITAL_BASED_OUTPATIENT_CLINIC_OR_DEPARTMENT_OTHER): Payer: Self-pay

## 2022-07-07 DIAGNOSIS — R1084 Generalized abdominal pain: Secondary | ICD-10-CM | POA: Insufficient documentation

## 2022-07-07 MED ORDER — IOHEXOL 300 MG/ML  SOLN
100.0000 mL | Freq: Once | INTRAMUSCULAR | Status: AC | PRN
  Administered 2022-07-07: 100 mL via INTRAVENOUS

## 2022-07-07 MED ORDER — VITAMIN D3 1.25 MG (50000 UT) PO CAPS
1.0000 | ORAL_CAPSULE | ORAL | 3 refills | Status: DC
  Filled 2022-07-07: qty 13, 90d supply, fill #0
  Filled 2022-09-30: qty 13, 90d supply, fill #1
  Filled 2023-04-12: qty 13, 90d supply, fill #2

## 2022-07-08 ENCOUNTER — Other Ambulatory Visit (HOSPITAL_BASED_OUTPATIENT_CLINIC_OR_DEPARTMENT_OTHER): Payer: Self-pay

## 2022-07-08 MED ORDER — TAMSULOSIN HCL 0.4 MG PO CAPS
0.4000 mg | ORAL_CAPSULE | Freq: Every day | ORAL | 0 refills | Status: DC
  Filled 2022-07-08: qty 30, 30d supply, fill #0

## 2022-08-04 ENCOUNTER — Other Ambulatory Visit (HOSPITAL_BASED_OUTPATIENT_CLINIC_OR_DEPARTMENT_OTHER): Payer: Self-pay

## 2022-08-04 MED ORDER — TAMSULOSIN HCL 0.4 MG PO CAPS
0.4000 mg | ORAL_CAPSULE | Freq: Every day | ORAL | 0 refills | Status: DC
  Filled 2022-08-04: qty 30, 30d supply, fill #0

## 2022-08-17 ENCOUNTER — Other Ambulatory Visit (HOSPITAL_BASED_OUTPATIENT_CLINIC_OR_DEPARTMENT_OTHER): Payer: Self-pay

## 2022-08-17 MED ORDER — OXYCODONE HCL 5 MG PO TABS
5.0000 mg | ORAL_TABLET | Freq: Three times a day (TID) | ORAL | 0 refills | Status: AC | PRN
  Filled 2022-08-17: qty 10, 4d supply, fill #0

## 2022-09-02 ENCOUNTER — Other Ambulatory Visit (HOSPITAL_BASED_OUTPATIENT_CLINIC_OR_DEPARTMENT_OTHER): Payer: Self-pay

## 2022-09-04 ENCOUNTER — Other Ambulatory Visit (HOSPITAL_BASED_OUTPATIENT_CLINIC_OR_DEPARTMENT_OTHER): Payer: Self-pay

## 2022-09-05 ENCOUNTER — Other Ambulatory Visit (HOSPITAL_BASED_OUTPATIENT_CLINIC_OR_DEPARTMENT_OTHER): Payer: Self-pay

## 2022-09-06 ENCOUNTER — Other Ambulatory Visit (HOSPITAL_BASED_OUTPATIENT_CLINIC_OR_DEPARTMENT_OTHER): Payer: Self-pay

## 2022-09-06 MED ORDER — TAMSULOSIN HCL 0.4 MG PO CAPS
0.4000 mg | ORAL_CAPSULE | Freq: Every day | ORAL | 0 refills | Status: AC
  Filled 2022-09-06: qty 90, 90d supply, fill #0

## 2022-09-30 ENCOUNTER — Other Ambulatory Visit (HOSPITAL_BASED_OUTPATIENT_CLINIC_OR_DEPARTMENT_OTHER): Payer: Self-pay

## 2022-10-04 ENCOUNTER — Other Ambulatory Visit (HOSPITAL_BASED_OUTPATIENT_CLINIC_OR_DEPARTMENT_OTHER): Payer: Self-pay

## 2022-10-24 ENCOUNTER — Other Ambulatory Visit: Payer: Self-pay | Admitting: Urology

## 2022-11-04 NOTE — Progress Notes (Signed)
Left voicemail for pt to return call.

## 2022-11-07 ENCOUNTER — Encounter (HOSPITAL_BASED_OUTPATIENT_CLINIC_OR_DEPARTMENT_OTHER): Payer: Self-pay | Admitting: Urology

## 2022-11-07 NOTE — Progress Notes (Signed)
Pre-op phone call complete. Procedure date and arrival time confirmed. Patient allergies, history, and medications verified. Patient denies any recent history of chest pain or COVID. Patient to be NPO at midnight. Patient advised not to take [facility] until after procedure. Driver secured.

## 2022-11-11 ENCOUNTER — Ambulatory Visit (HOSPITAL_COMMUNITY): Payer: No Typology Code available for payment source

## 2022-11-11 ENCOUNTER — Encounter (HOSPITAL_BASED_OUTPATIENT_CLINIC_OR_DEPARTMENT_OTHER): Payer: Self-pay | Admitting: Urology

## 2022-11-11 ENCOUNTER — Ambulatory Visit (HOSPITAL_BASED_OUTPATIENT_CLINIC_OR_DEPARTMENT_OTHER)
Admission: RE | Admit: 2022-11-11 | Discharge: 2022-11-11 | Disposition: A | Discharge status: Home / Self Care | Payer: No Typology Code available for payment source | Attending: Urology | Admitting: Urology

## 2022-11-11 ENCOUNTER — Other Ambulatory Visit: Payer: Self-pay

## 2022-11-11 ENCOUNTER — Encounter (HOSPITAL_BASED_OUTPATIENT_CLINIC_OR_DEPARTMENT_OTHER)
Admission: RE | Disposition: A | Discharge status: Home / Self Care | Payer: Self-pay | Source: Home / Self Care | Attending: Urology

## 2022-11-11 DIAGNOSIS — J45909 Unspecified asthma, uncomplicated: Secondary | ICD-10-CM | POA: Insufficient documentation

## 2022-11-11 DIAGNOSIS — N200 Calculus of kidney: Secondary | ICD-10-CM | POA: Diagnosis present

## 2022-11-11 DIAGNOSIS — Z7901 Long term (current) use of anticoagulants: Secondary | ICD-10-CM | POA: Diagnosis not present

## 2022-11-11 DIAGNOSIS — I10 Essential (primary) hypertension: Secondary | ICD-10-CM | POA: Insufficient documentation

## 2022-11-11 DIAGNOSIS — Z7982 Long term (current) use of aspirin: Secondary | ICD-10-CM | POA: Insufficient documentation

## 2022-11-11 DIAGNOSIS — G4730 Sleep apnea, unspecified: Secondary | ICD-10-CM | POA: Insufficient documentation

## 2022-11-11 DIAGNOSIS — E669 Obesity, unspecified: Secondary | ICD-10-CM | POA: Insufficient documentation

## 2022-11-11 HISTORY — PX: EXTRACORPOREAL SHOCK WAVE LITHOTRIPSY: SHX1557

## 2022-11-11 SURGERY — LITHOTRIPSY, ESWL
Anesthesia: LOCAL | Laterality: Right

## 2022-11-11 MED ORDER — DIAZEPAM 5 MG PO TABS
ORAL_TABLET | ORAL | Status: AC
  Filled 2022-11-11: qty 2

## 2022-11-11 MED ORDER — DIAZEPAM 5 MG PO TABS
10.0000 mg | ORAL_TABLET | ORAL | Status: AC
  Administered 2022-11-11: 10 mg via ORAL

## 2022-11-11 MED ORDER — SODIUM CHLORIDE 0.9 % IV SOLN: INTRAVENOUS | Status: DC

## 2022-11-11 MED ORDER — CIPROFLOXACIN HCL 500 MG PO TABS
ORAL_TABLET | ORAL | Status: AC
  Filled 2022-11-11: qty 1

## 2022-11-11 MED ORDER — DIPHENHYDRAMINE HCL 25 MG PO CAPS
ORAL_CAPSULE | ORAL | Status: AC
  Filled 2022-11-11: qty 1

## 2022-11-11 MED ORDER — DIPHENHYDRAMINE HCL 25 MG PO CAPS
25.0000 mg | ORAL_CAPSULE | ORAL | Status: AC
  Administered 2022-11-11: 25 mg via ORAL

## 2022-11-11 MED ORDER — CIPROFLOXACIN HCL 500 MG PO TABS
500.0000 mg | ORAL_TABLET | ORAL | Status: AC
  Administered 2022-11-11: 500 mg via ORAL

## 2022-11-11 NOTE — Discharge Instructions (Signed)
See [facility] discharge instructions in chart.  

## 2022-11-11 NOTE — Op Note (Signed)
See [facility] OP note scanned into chart. Also because of the size, density, location and other factors that cannot be anticipated I feel this will likely be a staged procedure. This fact supersedes any indication in the scanned [facility] operative note to the contrary.  

## 2022-11-11 NOTE — H&P (Signed)
CC/HPI: 11.1.2023: 57 year old male presents for evaluation/management of urolithiasis. Several weeks ago began having right-sided back pain. CT was performed revealing a 5 mm upper and 4 mm lower ureteral stone on the right. No other calculi noted. Since then he has been relatively pain-free. No prior history of kidney stones. No gross hematuria. No significant voiding symptoms.   12.6.2023: No recent right-sided flank pain.     ALLERGIES: No Allergies    MEDICATIONS: None   GU PSH: No GU PSH    NON-GU PSH: No Non-GU PSH    GU PMH: Renal and ureteral calculus, Relatively asymptomatic right upper and lower ureteral calculi with minimal if any hydronephrosis. Patient would like to continue MAT - 08/17/2022    NON-GU PMH: Asthma GERD Hypercholesterolemia Hypertension Sleep Apnea    FAMILY HISTORY: prostate - Runs in Family   SOCIAL HISTORY: Marital Status: Married Ethnicity: Not Hispanic Or Latino; Race: White Current Smoking Status: Patient has never smoked.   Tobacco Use Assessment Completed: Used Tobacco in last 30 days? Has never drank.  Does not use drugs. Drinks 1 caffeinated drink per day.    REVIEW OF SYSTEMS:    GU Review Male:   Patient denies frequent urination, hard to postpone urination, burning/ pain with urination, get up at night to urinate, leakage of urine, stream starts and stops, trouble starting your stream, have to strain to urinate , erection problems, and penile pain.  Gastrointestinal (Upper):   Patient denies nausea, vomiting, and indigestion/ heartburn.  Gastrointestinal (Lower):   Patient denies diarrhea and constipation.  Constitutional:   Patient denies fever, night sweats, weight loss, and fatigue.  Skin:   Patient denies skin rash/ lesion and itching.  Eyes:   Patient denies blurred vision and double vision.  Ears/ Nose/ Throat:   Patient denies sore throat and sinus problems.  Hematologic/Lymphatic:   Patient denies swollen glands and easy  bruising.  Cardiovascular:   Patient denies leg swelling and chest pains.  Respiratory:   Patient denies shortness of breath and cough.  Endocrine:   Patient denies excessive thirst.  Musculoskeletal:   Patient denies back pain and joint pain.  Neurological:   Patient denies headaches and dizziness.  Psychologic:   Patient denies depression and anxiety.   Notes: Patient stated just recently he is having dull ache on both sides of mid back but nothing severe. He only took some otc ibuprofen if it was needed.    VITAL SIGNS: None   Complexity of Data:  Source Of History:  Patient  Records Review:   Previous Patient Records  Urine Test Review:   Urinalysis  X-Ray Review: KUB: Reviewed Films. Prior KUB images reviewed    PROCEDURES:         KUB - 40981  A single view of the abdomen is obtained. I independently and with the patient reviewed KUB from today as well as comparison film last month. Calcific density in the area of the right UPJ previously is now in the lower pole of the right kidney. I see no other calcifications along the course of either ureter or over the left renal contour.      . Patient confirmed No Neulasta OnPro Device.           Urinalysis w/Scope Dipstick Dipstick Cont'd Micro  Color: Yellow Bilirubin: Neg mg/dL WBC/hpf: 0 - 5/hpf  Appearance: Clear Ketones: Neg mg/dL RBC/hpf: 3 - 10/hpf  Specific Gravity: 1.020 Blood: 1+ ery/uL Bacteria: Rare (0-9/hpf)  pH: <=5.0 Protein: Neg  mg/dL Cystals: NS (Not Seen)  Glucose: Neg mg/dL Urobilinogen: 0.2 mg/dL Casts: NS (Not Seen)    Nitrites: Neg Trichomonas: Not Present    Leukocyte Esterase: Neg leu/uL Mucous: Present      Epithelial Cells: 0 - 5/hpf      Yeast: NS (Not Seen)      Sperm: Not Present    ASSESSMENT:      ICD-10 Details  1 GU:   Renal and ureteral calculus - N20.2 Chronic, Stable - Right UPJ stone now back and right lower pole, patient asymptomatic     PLAN:            Medications Stop Meds:  Oxycodone Hcl 5 mg tablet 1 tablet PO q 8 hr prn severe pain  Start: 08/17/2022  Discontinue: 09/21/2022  - Reason: The patient did not comply with the medication's usage instructions.            Document Letter(s):  Created for Patient: Clinical Summary         Notes:   At this point, urgent management is not necessary. However, with the fact that the stone moved, I would recommend eventual treatment. I did discuss with him shockwave lithotripsy which I think would be the best treatment for that 1 solitary stone.   He would like to call us to schedule as there are family matters that have to be taken care of.

## 2022-11-14 ENCOUNTER — Encounter (HOSPITAL_BASED_OUTPATIENT_CLINIC_OR_DEPARTMENT_OTHER): Payer: Self-pay | Admitting: Urology

## 2022-11-29 ENCOUNTER — Other Ambulatory Visit (HOSPITAL_BASED_OUTPATIENT_CLINIC_OR_DEPARTMENT_OTHER): Payer: Self-pay

## 2022-11-29 MED ORDER — PEG 3350-KCL-NA BICARB-NACL 420 G PO SOLR
ORAL | 0 refills | Status: DC
  Filled 2022-11-29: qty 4000, 1d supply, fill #0

## 2022-11-30 ENCOUNTER — Other Ambulatory Visit (HOSPITAL_BASED_OUTPATIENT_CLINIC_OR_DEPARTMENT_OTHER): Payer: Self-pay

## 2022-11-30 MED ORDER — NEBIVOLOL HCL 5 MG PO TABS
5.0000 mg | ORAL_TABLET | Freq: Every day | ORAL | 0 refills | Status: DC
  Filled 2022-11-30: qty 90, 90d supply, fill #0

## 2022-12-15 ENCOUNTER — Other Ambulatory Visit (HOSPITAL_BASED_OUTPATIENT_CLINIC_OR_DEPARTMENT_OTHER): Payer: Self-pay

## 2022-12-15 ENCOUNTER — Other Ambulatory Visit: Payer: Self-pay

## 2022-12-15 MED ORDER — BISACODYL 5 MG PO TBEC: DELAYED_RELEASE_TABLET | ORAL | 0 refills | Status: AC

## 2022-12-15 MED ORDER — PEG 3350-KCL-NA BICARB-NACL 420 G PO SOLR
ORAL | 0 refills | Status: AC
  Filled 2022-12-15: qty 8000, 2d supply, fill #0

## 2022-12-17 ENCOUNTER — Other Ambulatory Visit (HOSPITAL_BASED_OUTPATIENT_CLINIC_OR_DEPARTMENT_OTHER): Payer: Self-pay

## 2022-12-28 ENCOUNTER — Other Ambulatory Visit (HOSPITAL_BASED_OUTPATIENT_CLINIC_OR_DEPARTMENT_OTHER): Payer: Self-pay

## 2022-12-28 MED ORDER — TAMSULOSIN HCL 0.4 MG PO CAPS
0.4000 mg | ORAL_CAPSULE | Freq: Every day | ORAL | 3 refills | Status: AC
  Filled 2022-12-28: qty 30, 30d supply, fill #0
  Filled 2023-01-22: qty 30, 30d supply, fill #1

## 2023-01-17 ENCOUNTER — Encounter (HOSPITAL_BASED_OUTPATIENT_CLINIC_OR_DEPARTMENT_OTHER): Payer: Self-pay | Admitting: Pharmacist

## 2023-01-17 ENCOUNTER — Other Ambulatory Visit (HOSPITAL_BASED_OUTPATIENT_CLINIC_OR_DEPARTMENT_OTHER): Payer: Self-pay

## 2023-01-17 MED ORDER — WEGOVY 1 MG/0.5ML ~~LOC~~ SOAJ
1.0000 mg | SUBCUTANEOUS | 0 refills | Status: DC
  Filled 2023-01-17: qty 2, 28d supply, fill #0

## 2023-01-22 ENCOUNTER — Other Ambulatory Visit (HOSPITAL_BASED_OUTPATIENT_CLINIC_OR_DEPARTMENT_OTHER): Payer: Self-pay

## 2023-01-23 ENCOUNTER — Other Ambulatory Visit (HOSPITAL_BASED_OUTPATIENT_CLINIC_OR_DEPARTMENT_OTHER): Payer: Self-pay | Admitting: Family Medicine

## 2023-01-23 ENCOUNTER — Other Ambulatory Visit (HOSPITAL_BASED_OUTPATIENT_CLINIC_OR_DEPARTMENT_OTHER): Payer: Self-pay

## 2023-01-23 DIAGNOSIS — R911 Solitary pulmonary nodule: Secondary | ICD-10-CM

## 2023-01-23 MED ORDER — PREDNISONE 10 MG PO TABS
ORAL_TABLET | ORAL | 0 refills | Status: DC
  Filled 2023-01-23: qty 21, 6d supply, fill #0

## 2023-01-26 ENCOUNTER — Other Ambulatory Visit (HOSPITAL_BASED_OUTPATIENT_CLINIC_OR_DEPARTMENT_OTHER): Payer: Self-pay

## 2023-01-26 MED ORDER — AMOXICILLIN-POT CLAVULANATE 875-125 MG PO TABS
1.0000 | ORAL_TABLET | Freq: Two times a day (BID) | ORAL | 0 refills | Status: DC
  Filled 2023-01-26: qty 14, 7d supply, fill #0

## 2023-01-31 ENCOUNTER — Ambulatory Visit (HOSPITAL_BASED_OUTPATIENT_CLINIC_OR_DEPARTMENT_OTHER)
Admission: RE | Admit: 2023-01-31 | Discharge: 2023-01-31 | Disposition: A | Discharge status: Home / Self Care | Payer: No Typology Code available for payment source | Source: Ambulatory Visit | Attending: Family Medicine | Admitting: Family Medicine

## 2023-01-31 DIAGNOSIS — R911 Solitary pulmonary nodule: Secondary | ICD-10-CM

## 2023-02-22 ENCOUNTER — Other Ambulatory Visit (HOSPITAL_BASED_OUTPATIENT_CLINIC_OR_DEPARTMENT_OTHER): Payer: Self-pay

## 2023-02-22 MED ORDER — ROSUVASTATIN CALCIUM 10 MG PO TABS
10.0000 mg | ORAL_TABLET | Freq: Every day | ORAL | 3 refills | Status: DC
  Filled 2023-02-22: qty 90, 90d supply, fill #0
  Filled 2023-05-18: qty 90, 90d supply, fill #1
  Filled 2023-08-17: qty 90, 90d supply, fill #2
  Filled 2023-11-16: qty 90, 90d supply, fill #3

## 2023-02-23 ENCOUNTER — Other Ambulatory Visit (HOSPITAL_BASED_OUTPATIENT_CLINIC_OR_DEPARTMENT_OTHER): Payer: Self-pay

## 2023-02-23 MED ORDER — NEBIVOLOL HCL 5 MG PO TABS
5.0000 mg | ORAL_TABLET | Freq: Every day | ORAL | 1 refills | Status: DC
  Filled 2023-02-23: qty 90, 90d supply, fill #0
  Filled 2023-05-18: qty 90, 90d supply, fill #1

## 2023-03-31 ENCOUNTER — Other Ambulatory Visit (HOSPITAL_BASED_OUTPATIENT_CLINIC_OR_DEPARTMENT_OTHER): Payer: Self-pay

## 2023-03-31 MED ORDER — PREDNISONE 10 MG PO TABS
ORAL_TABLET | ORAL | 0 refills | Status: AC
  Filled 2023-03-31: qty 21, 6d supply, fill #0

## 2023-03-31 MED ORDER — AIRSUPRA 90-80 MCG/ACT IN AERO
2.0000 | INHALATION_SPRAY | Freq: Every day | RESPIRATORY_TRACT | 1 refills | Status: AC
  Filled 2023-03-31: qty 21.4, 30d supply, fill #0
  Filled 2023-04-26: qty 21.4, 30d supply, fill #1

## 2023-03-31 MED ORDER — AMOXICILLIN-POT CLAVULANATE 875-125 MG PO TABS
ORAL_TABLET | ORAL | 0 refills | Status: AC
  Filled 2023-03-31: qty 14, 7d supply, fill #0

## 2023-04-27 ENCOUNTER — Other Ambulatory Visit (HOSPITAL_COMMUNITY): Payer: Self-pay

## 2023-05-01 ENCOUNTER — Other Ambulatory Visit (HOSPITAL_BASED_OUTPATIENT_CLINIC_OR_DEPARTMENT_OTHER): Payer: Self-pay

## 2023-05-15 ENCOUNTER — Ambulatory Visit (INDEPENDENT_AMBULATORY_CARE_PROVIDER_SITE_OTHER): Payer: No Typology Code available for payment source | Admitting: Podiatry

## 2023-05-15 DIAGNOSIS — M79675 Pain in left toe(s): Secondary | ICD-10-CM | POA: Diagnosis not present

## 2023-05-15 DIAGNOSIS — B351 Tinea unguium: Secondary | ICD-10-CM

## 2023-05-15 DIAGNOSIS — L600 Ingrowing nail: Secondary | ICD-10-CM

## 2023-05-15 NOTE — Progress Notes (Signed)
Subjective:   Patient ID: Joe Huffman, male   DOB: 57 y.o.   MRN: [id number]   HPI Chief Complaint  Patient presents with   Nail Problem    Left hallux nail thick and discolored     57 year old male presents the office for above concerns.  History of ingrown toenails for many years. It was removed previously in [place] and in the [place] as well. He found some shoes with steal toe to protect it. The left big toe was growing weird when the pandemic happeneded then it grewout etter. More resectely the nail started ot get more thicker and it hurts with pressure.    Review of Systems  All other systems reviewed and are negative.  Past Medical History:  Diagnosis Date   Asthma    Back pain    Edema of both lower extremities    High blood pressure    High cholesterol    Joint pain    Knee pain    Obesity    Sleep apnea    SOB (shortness of breath)     Past Surgical History:  Procedure Laterality Date   BILATERAL CARPAL TUNNEL RELEASE     EXTRACORPOREAL SHOCK WAVE LITHOTRIPSY Right 11/11/2022   Procedure: EXTRACORPOREAL SHOCK WAVE LITHOTRIPSY (ESWL);  Surgeon: Noel Christmas, MD;  Location: [facility];  Service: Urology;  Laterality: Right;   TONSILLECTOMY     WISDOM TOOTH EXTRACTION       Current Outpatient Medications:    Albuterol-Budesonide (AIRSUPRA) 90-80 MCG/ACT AERO, Inhale 2 puffs into the lungs 6 (six) times daily., Disp: 21.4 g, Rfl: 1   amoxicillin-clavulanate (AUGMENTIN) 875-125 MG tablet, 1 tablet Orally every 12 hrs 7 days, Disp: 14 tablet, Rfl: 0   aspirin EC 81 MG tablet, Take 81 mg by mouth daily. Swallow whole., Disp: , Rfl:    bisacodyl (DULCOLAX) 5 MG EC tablet, Take 2 tablets at 12 noon and 2 tablets at 2 pm, Disp: 8 tablet, Rfl: 0   Cholecalciferol (VITAMIN D3) 1.25 MG (50000 UT) CAPS, Take 1 capsule by mouth once a week., Disp: 13 capsule, Rfl: 3   COVID-19 mRNA bivalent vaccine, Pfizer, (PFIZER COVID-19 VAC BIVALENT) injection,  Inject into the muscle., Disp: 0.3 mL, Rfl: 0   fluticasone-salmeterol (ADVAIR) 100-50 MCG/ACT AEPB, Inhale 1 puff into the lungs 2 (two) times daily., Disp: 60 each, Rfl: 0   Multiple Vitamin (MULTIVITAMIN) capsule, Take 1 capsule by mouth daily., Disp: , Rfl:    nebivolol (BYSTOLIC) 5 MG tablet, 1 tablet Orally Once a day 90 days, Disp: 90 tablet, Rfl: 0   nebivolol (BYSTOLIC) 5 MG tablet, Take 1 tablet (5 mg total) by mouth daily., Disp: 90 tablet, Rfl: 1   oxyCODONE (OXY IR/ROXICODONE) 5 MG immediate release tablet, Take 1 tablet (5 mg total) by mouth every 8 (eight) hours as needed for severe pain., Disp: 10 tablet, Rfl: 0   polyethylene glycol-electrolytes (NULYTELY) 420 g solution, Take as directed by office, Disp: 8000 mL, Rfl: 0   rosuvastatin (CRESTOR) 10 MG tablet, Take 1 tablet (10 mg total) by mouth daily., Disp: 90 tablet, Rfl: 3   Semaglutide-Weight Management (WEGOVY) 1 MG/0.5ML SOAJ, Inject 1 mg into the skin once a week., Disp: 2 mL, Rfl: 0   tamsulosin (FLOMAX) 0.4 MG CAPS capsule, Take 1 capsule (0.4 mg total) by mouth daily., Disp: 90 capsule, Rfl: 0   tamsulosin (FLOMAX) 0.4 MG CAPS capsule, Take 1 capsule (0.4 mg total) by mouth daily., Disp:  30 capsule, Rfl: 3   Vitamin D, Cholecalciferol, 25 MCG (1000 UT) TABS, Take 25 mcg by mouth daily at 12 noon., Disp: , Rfl:   No Known Allergies        Objective:  Physical Exam  General: AAO x3, NAD  Dermatological: Left hallux nails hypertrophic, dystrophic.  There is yellow, brown discoloration is quite thick.  It is incurvated on nail borders.  No edema, erythema or signs of infection today.    Vascular: Dorsalis Pedis artery and Posterior Tibial artery pedal pulses are 2/4 bilateral with immedate capillary fill time. There is no pain with calf compression, swelling, warmth, erythema.   Neruologic: Grossly intact via light touch bilateral.   Musculoskeletal: No gross boney pedal deformities bilateral. No pain, crepitus,  or limitation noted with foot and ankle range of motion bilateral. Muscular strength 5/5 in all groups tested bilateral.      Assessment:   Left hallux onychodystrophy, ingrown toenail     Plan:  -Treatment options discussed including all alternatives, risks, and complications -Etiology of symptoms were discussed -We discussed the conservative as well as surgical options.  He agrees to have the nail removed today. -At this time, recommended total nail removal without chemical matricectomy to the left nail given the thickening, discomfort for nail.  Risks and complications were discussed with the patient for which they understand and  verbally consent to the procedure. Under sterile conditions a total of 3 mL of a mixture of 2% lidocaine plain and 0.5% Marcaine plain was infiltrated in a hallux block fashion. Once anesthetized, the skin was prepped in sterile fashion. A tourniquet was then applied. Next the left hallux nail was removed in total making to remove all nail borders.  Once the nail was  Removed, the area was debrided and the underlying skin was intact. The area was irrigated and hemostasis was obtained.  A dry sterile dressing was applied. After application of the dressing the tourniquet was removed and there is found to be an immediate capillary refill time to the digit. The patient tolerated the procedure well any complications. Post procedure instructions were discussed the patient for which he verbally understood. Follow-up in one week for nail check or sooner if any problems are to arise. Discussed signs/symptoms of worsening infection and directed to call the office immediately should any occur or go directly to the emergency room. In the meantime, encouraged to call the office with any questions, concerns, changes symptoms. -Nail sent for culture  Vivi Barrack DPM

## 2023-05-15 NOTE — Patient Instructions (Signed)

## 2023-05-31 ENCOUNTER — Encounter: Payer: Self-pay | Admitting: Podiatry

## 2023-05-31 IMAGING — CT CT CARDIAC CORONARY ARTERY CALCIUM SCORE
3 series · 14 of 20 positions shown, 16 images · non-contrast
Comparison: None Available.
COMPARISON: None Available.

Addendum:
CLINICAL DATA: This over-read does not include interpretation of
cardiac or coronary anatomy or pathology. The coronary calcium score
interpretation by the cardiologist is attached.
CLINICAL DATA: Cardiovascular Disease Risk stratification

EXAM:
Coronary Calcium Score
TECHNIQUE: A gated, non-contrast computed tomography scan of the heart was
performed using 3mm slice thickness. Axial images were analyzed on a
dedicated workstation. Calcium scoring of the coronary arteries was
performed using the Agatston method.

[Series 2: ax lung · axial · 0.98mm/px · z∈[+1345,+1457]mm · 5 of 86 slices shown]
[im 15/86  lung]
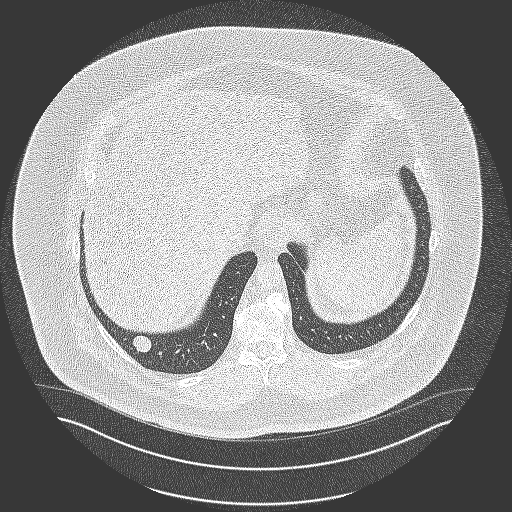
[im 29/86  lung]
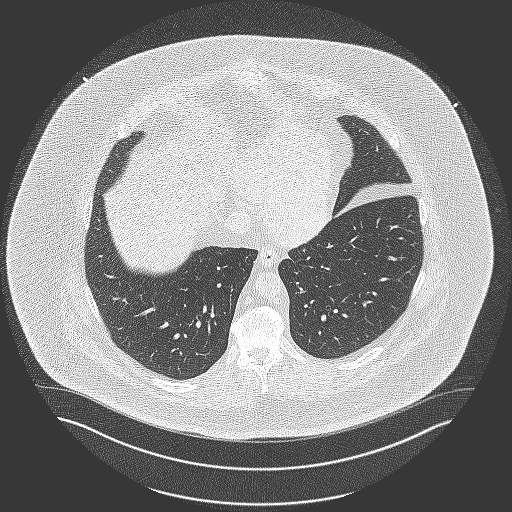
[im 43/86  lung]
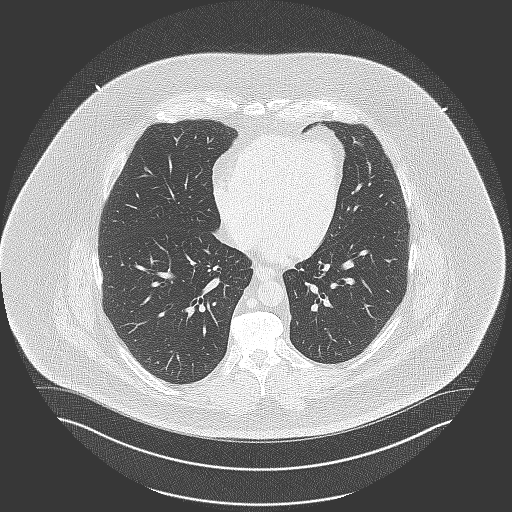
[im 57/86  lung]
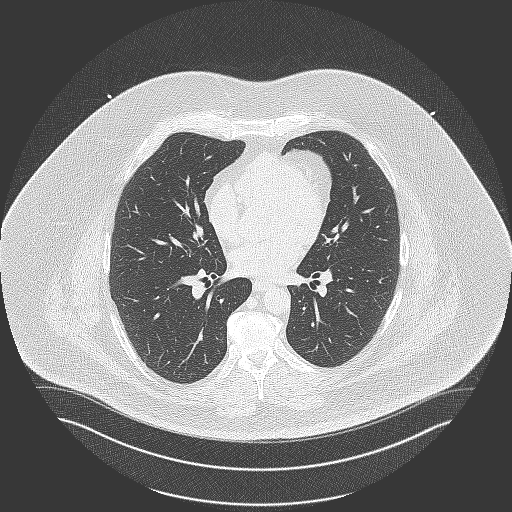
[im 71/86  lung]
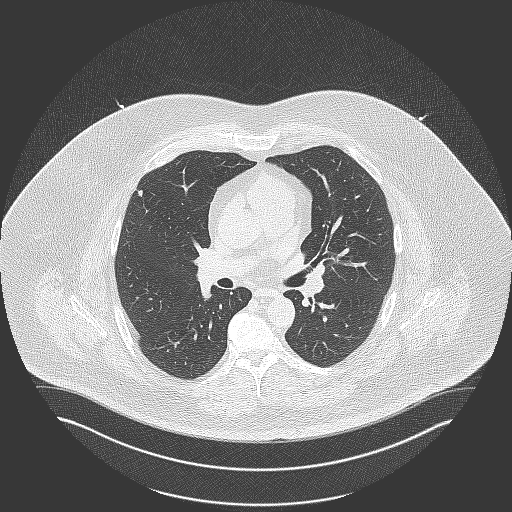

[Series 3: ax st · axial · 0.98mm/px · z∈[+1341,+1461]mm · 6 of 86 slices shown, 8 images]
[im 13/86  vessel]
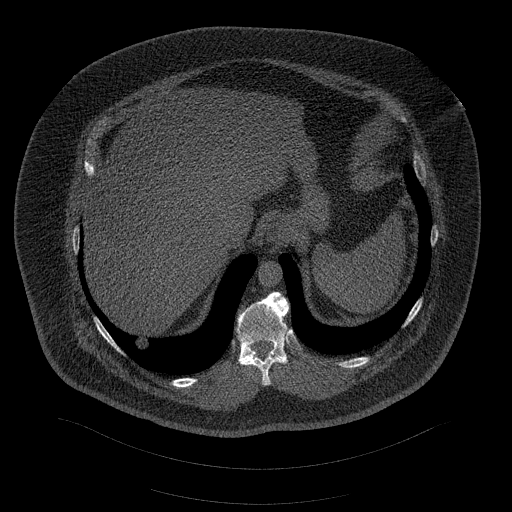
[im 13/86  lung]
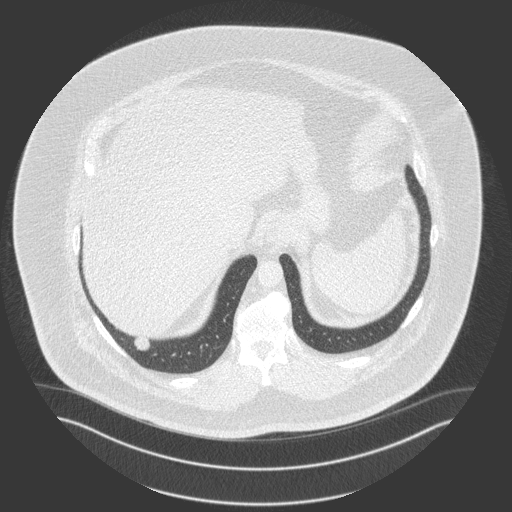
[im 25/86  vessel]
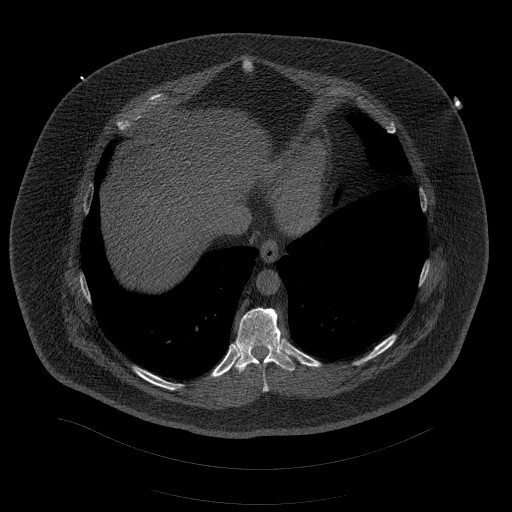
[im 37/86  vessel]
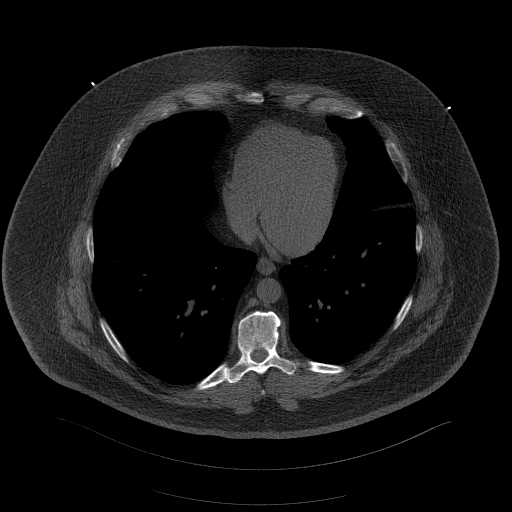
[im 49/86  vessel]
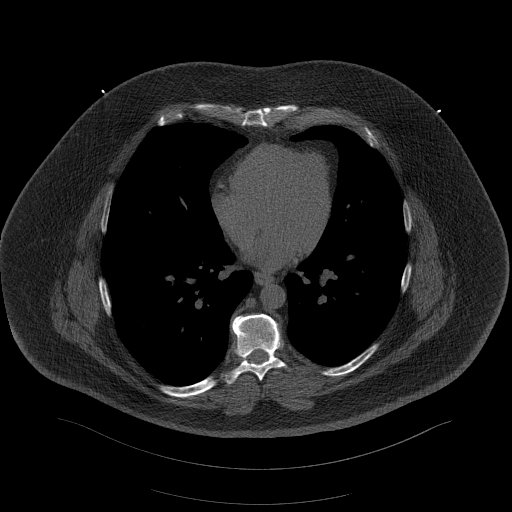
[im 61/86  vessel]
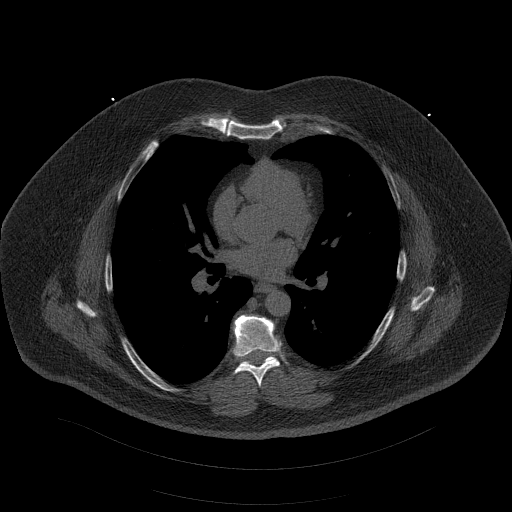
[im 61/86  lung]
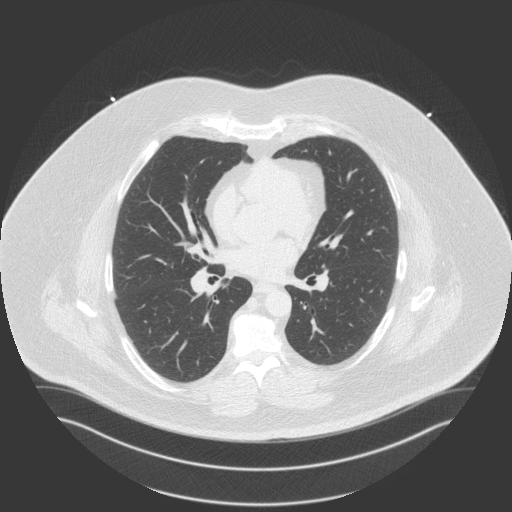
[im 73/86  vessel]
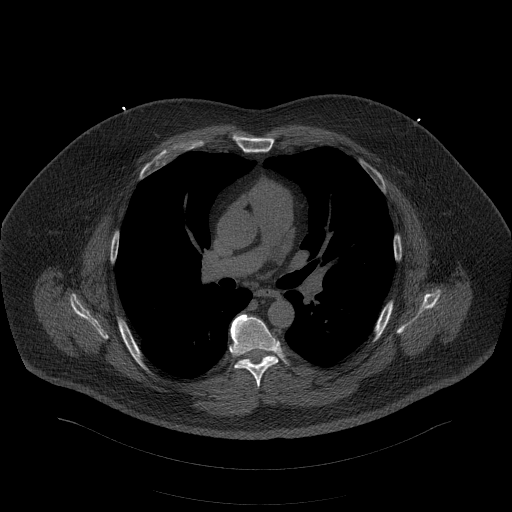

[Series 4: cascseq 3.0 sa36 70% (id) · axial · 0.39mm/px · z∈[+1360,+1444]mm · 3 of 57 slices shown]
[im 15/57  vessel]
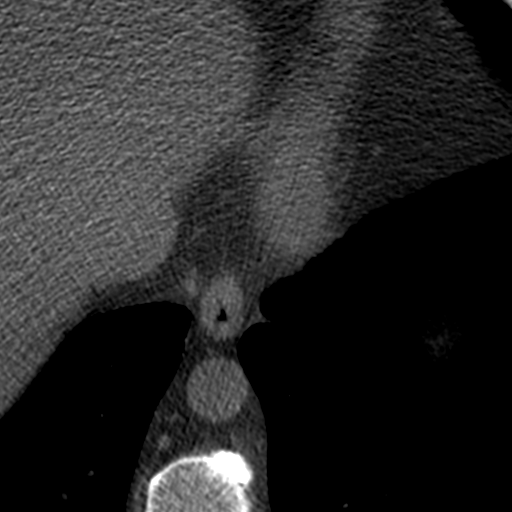
[im 29/57  vessel]
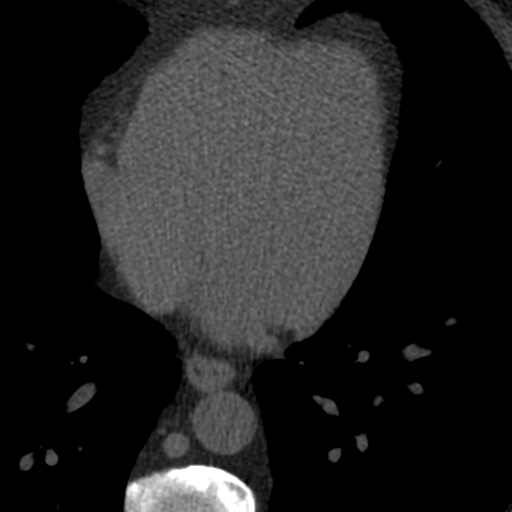
[im 43/57  vessel]
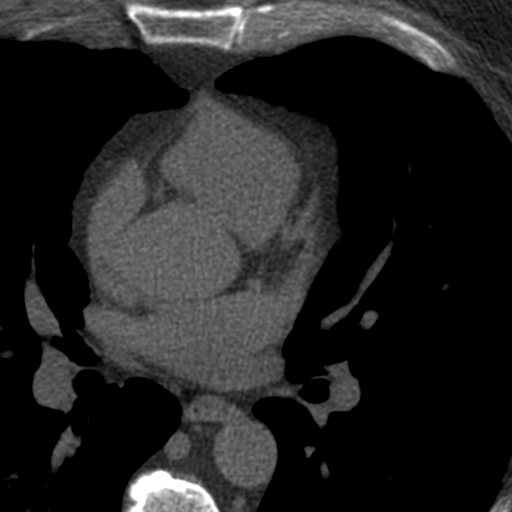

[14 of 20 positions shown; findings below may reference images not displayed]

FINDINGS: Vascular: Normal heart size. No pericardial effusion. Normal caliber
thoracic aorta with no significant atherosclerotic disease.

Mediastinum/Nodes: Small hiatal hernia. No pathologically enlarged
lymph nodes seen in the chest.

Lungs/Pleura: Central airways are patent. No consolidation, pleural
effusion or pneumothorax. Well-circumscribed solid pulmonary nodule
of the right lower lobe measuring 1.7 x 1.4 cm on series 2, image
72. Small pulmonary nodule of the right middle lobe measuring 5 mm
on series 2, image 16. Small solid pulmonary nodule the right lower
lobe measuring 5 mm on series 2, image 45.

Upper Abdomen: Hepatic steatosis.

Musculoskeletal: No aggressive appearing osseous lesions.
IMPRESSION: 1. Well-circumscribed solid pulmonary nodule in the right lower lobe
measuring 15.5 mm in mean diameter. Consider one of the following in
3 months for both low-risk and high-risk individuals: (a) repeat
chest CT, (b) follow-up PET-CT, or (c) tissue sampling. This
recommendation follows the consensus statement: Guidelines for
Management of Incidental Pulmonary Nodules Detected on CT Images:
2. Paddock steatosis.
FINDINGS: Coronary arteries: Normal origins.

Coronary Calcium Score:

Left main: 0

Left anterior descending artery: 1

Left circumflex artery: 0

Right coronary artery: 0

Total: 1

Percentile: 46th

Pericardium: Normal.

Aorta: Normal to borderline enlarged caliber of ascending aorta. No
aortic atherosclerosis noted.

Non-cardiac: See separate report from [REDACTED].
IMPRESSION: Coronary calcium score of 1. This was 46th percentile for age-,
race-, and sex-matched controls.



If CAC=0, it is reasonable to withhold statin therapy and reassess
in 5 to 10 years, as long as higher risk conditions are absent
(diabetes mellitus, family history of premature CHD in first degree
relatives (males <55 years; females <65 years), cigarette smoking,
or LDL >=190 mg/dL).

If CAC is 1 to 99, it is reasonable to initiate statin therapy for
patients >=55 years of age.

If CAC is >=100 or >=75th percentile, it is reasonable to initiate
statin therapy at any age.

Cardiology referral should be considered for patients with CAC
scores >=400 or >=75th percentile.

*5007 AHA/ACC/AACVPR/AAPA/ABC/ADAM VALHEY/YURIDIA/SIDWELL/Nila/PADAM/SORIN/[place]
Guideline on the Management of Blood Cholesterol: A Report of the
American College of Cardiology/American Heart Association Task Force
on Clinical Practice Guidelines. J Am Coll Cardiol.
0436;73(24):7710-7968.

*** End of Addendum ***
FINDINGS: Vascular: Normal heart size. No pericardial effusion. Normal caliber
thoracic aorta with no significant atherosclerotic disease.

Mediastinum/Nodes: Small hiatal hernia. No pathologically enlarged
lymph nodes seen in the chest.

Lungs/Pleura: Central airways are patent. No consolidation, pleural
effusion or pneumothorax. Well-circumscribed solid pulmonary nodule
of the right lower lobe measuring 1.7 x 1.4 cm on series 2, image
72. Small pulmonary nodule of the right middle lobe measuring 5 mm
on series 2, image 16. Small solid pulmonary nodule the right lower
lobe measuring 5 mm on series 2, image 45.

Upper Abdomen: Hepatic steatosis.

Musculoskeletal: No aggressive appearing osseous lesions.
IMPRESSION: 1. Well-circumscribed solid pulmonary nodule in the right lower lobe
measuring 15.5 mm in mean diameter. Consider one of the following in
3 months for both low-risk and high-risk individuals: (a) repeat
chest CT, (b) follow-up PET-CT, or (c) tissue sampling. This
recommendation follows the consensus statement: Guidelines for
Management of Incidental Pulmonary Nodules Detected on CT Images:
2. Paddock steatosis.

## 2023-07-10 ENCOUNTER — Encounter: Payer: Self-pay | Admitting: Podiatry

## 2023-07-21 ENCOUNTER — Other Ambulatory Visit (HOSPITAL_BASED_OUTPATIENT_CLINIC_OR_DEPARTMENT_OTHER): Payer: Self-pay

## 2023-07-25 ENCOUNTER — Other Ambulatory Visit (HOSPITAL_BASED_OUTPATIENT_CLINIC_OR_DEPARTMENT_OTHER): Payer: Self-pay

## 2023-07-25 MED ORDER — AMOXICILLIN-POT CLAVULANATE 875-125 MG PO TABS
1.0000 | ORAL_TABLET | Freq: Two times a day (BID) | ORAL | 0 refills | Status: AC
  Filled 2023-07-25: qty 14, 7d supply, fill #0

## 2023-07-25 MED ORDER — BENZONATATE 200 MG PO CAPS
200.0000 mg | ORAL_CAPSULE | Freq: Three times a day (TID) | ORAL | 0 refills | Status: AC | PRN
  Filled 2023-07-25: qty 30, 10d supply, fill #0

## 2023-07-25 MED ORDER — VITAMIN D3 1.25 MG (50000 UT) PO CAPS
1.0000 | ORAL_CAPSULE | ORAL | 3 refills | Status: AC
  Filled 2023-07-25: qty 13, 90d supply, fill #0
  Filled 2023-12-03: qty 13, 90d supply, fill #1
  Filled 2024-04-10: qty 13, 90d supply, fill #2

## 2023-08-29 ENCOUNTER — Other Ambulatory Visit (HOSPITAL_BASED_OUTPATIENT_CLINIC_OR_DEPARTMENT_OTHER): Payer: Self-pay

## 2023-08-30 ENCOUNTER — Other Ambulatory Visit (HOSPITAL_BASED_OUTPATIENT_CLINIC_OR_DEPARTMENT_OTHER): Payer: Self-pay

## 2023-08-30 MED ORDER — NEBIVOLOL HCL 5 MG PO TABS
5.0000 mg | ORAL_TABLET | Freq: Every day | ORAL | 1 refills | Status: AC
  Filled 2023-08-30: qty 90, 90d supply, fill #0
  Filled 2023-12-03: qty 90, 90d supply, fill #1

## 2023-11-16 ENCOUNTER — Other Ambulatory Visit (HOSPITAL_BASED_OUTPATIENT_CLINIC_OR_DEPARTMENT_OTHER): Payer: Self-pay

## 2023-11-16 MED ORDER — ZEPBOUND 7.5 MG/0.5ML ~~LOC~~ SOAJ
7.5000 mg | SUBCUTANEOUS | 0 refills | Status: AC
  Filled 2023-11-16 – 2023-11-17 (×2): qty 2, 28d supply, fill #0

## 2023-11-17 ENCOUNTER — Other Ambulatory Visit (HOSPITAL_BASED_OUTPATIENT_CLINIC_OR_DEPARTMENT_OTHER): Payer: Self-pay

## 2023-11-17 MED ORDER — OZEMPIC (2 MG/DOSE) 8 MG/3ML ~~LOC~~ SOPN
2.0000 mg | PEN_INJECTOR | SUBCUTANEOUS | 0 refills | Status: AC
  Filled 2023-11-17: qty 3, 28d supply, fill #0

## 2023-11-22 ENCOUNTER — Other Ambulatory Visit (HOSPITAL_BASED_OUTPATIENT_CLINIC_OR_DEPARTMENT_OTHER): Payer: Self-pay

## 2023-11-25 ENCOUNTER — Other Ambulatory Visit (HOSPITAL_BASED_OUTPATIENT_CLINIC_OR_DEPARTMENT_OTHER): Payer: Self-pay

## 2023-11-30 ENCOUNTER — Other Ambulatory Visit (HOSPITAL_BASED_OUTPATIENT_CLINIC_OR_DEPARTMENT_OTHER): Payer: Self-pay

## 2023-12-04 ENCOUNTER — Other Ambulatory Visit (HOSPITAL_BASED_OUTPATIENT_CLINIC_OR_DEPARTMENT_OTHER): Payer: Self-pay

## 2023-12-18 ENCOUNTER — Other Ambulatory Visit (HOSPITAL_BASED_OUTPATIENT_CLINIC_OR_DEPARTMENT_OTHER): Payer: Self-pay

## 2023-12-29 ENCOUNTER — Other Ambulatory Visit (HOSPITAL_BASED_OUTPATIENT_CLINIC_OR_DEPARTMENT_OTHER): Payer: Self-pay

## 2024-01-04 ENCOUNTER — Other Ambulatory Visit (HOSPITAL_BASED_OUTPATIENT_CLINIC_OR_DEPARTMENT_OTHER): Payer: Self-pay

## 2024-01-23 ENCOUNTER — Ambulatory Visit: Admitting: Podiatry

## 2024-01-26 ENCOUNTER — Ambulatory Visit (INDEPENDENT_AMBULATORY_CARE_PROVIDER_SITE_OTHER)

## 2024-01-26 ENCOUNTER — Ambulatory Visit (INDEPENDENT_AMBULATORY_CARE_PROVIDER_SITE_OTHER): Admitting: Podiatry

## 2024-01-26 ENCOUNTER — Other Ambulatory Visit (HOSPITAL_BASED_OUTPATIENT_CLINIC_OR_DEPARTMENT_OTHER): Payer: Self-pay

## 2024-01-26 DIAGNOSIS — M84375A Stress fracture, left foot, initial encounter for fracture: Secondary | ICD-10-CM | POA: Diagnosis not present

## 2024-01-26 DIAGNOSIS — M7752 Other enthesopathy of left foot: Secondary | ICD-10-CM

## 2024-01-26 DIAGNOSIS — M778 Other enthesopathies, not elsewhere classified: Secondary | ICD-10-CM | POA: Diagnosis not present

## 2024-01-26 MED ORDER — MELOXICAM 15 MG PO TABS
15.0000 mg | ORAL_TABLET | Freq: Every day | ORAL | 0 refills | Status: AC | PRN
  Filled 2024-01-26: qty 30, 30d supply, fill #0

## 2024-01-26 NOTE — Patient Instructions (Signed)
 When You Have a Walking Boot: Self-Care for Adults  A walking boot holds your foot or ankle in place after an injury or procedure. It helps with healing and stops your foot or ankle from getting hurt more. The boot has a hard, rigid outer frame. Its inner lining is a layer of padded material. The boots also have straps that you can adjust to secure them over your foot and leg. You may be given a walking boot if you can stand or walk on your injured foot. Ask how much you can walk while wearing the boot. How to put on your walking boot There are a few types of walking boots. Follow the steps for how to put on your certain type of boot. You may need to: Ask someone to help you put on the boot. Sit to put on the boot. Doing this is more comfortable. It also helps to prevent falls. Open up the boot fully. Put your foot in the boot so your heel rests against the back. Make sure your toes are supported by the base of the boot. They shouldn't hang over the front edge. Adjust the straps. The boot should feel secure but not too tight. Keep the boot straight. Do not bend the hard frame of the boot to get a good fit. How to walk with your walking boot How much you can walk will depend on your injury. Do not try to walk without wearing the boot until your health care provider says it's OK. While wearing the boot, you may need to: Use crutches or a cane as told. Wear a shoe with a heel on your other foot. This can help raise it to the height of the walking boot. Be careful when walking on surfaces that are uneven or wet. How to reduce swelling while using your walking boot  Rest your injured foot or leg as much as you can. Use ice or an ice pack as told. Take off your walking boot. Place a towel between your skin and the ice or between your cast and the ice. Leave the ice on for 20 minutes, 2-3 times a day. If your skin turns red, take off the ice right away to prevent skin damage. The risk of damage is  higher if you can't feel pain, heat, or cold. Move your toes often to reduce stiffness and swelling. Raise your foot or leg above the level of your heart while you're sitting down. Try to do this at least 2?3 hours each day. Use pillows as needed. If the swelling gets worse, loosen the boot. Rest your foot and leg. How to care for your skin and foot while using your walking boot Wear a long sock to stop your foot and leg from rubbing inside the boot. Take off the boot once a day to check the injured area. Check for sores, rashes, swelling, or wounds. The skin should be a healthy color. It shouldn't be pale or blue. Try to watch your walking pattern (gait) in the boot. Make sure it's fairly normal and that you don't walk with a clear limp. Take care of any wound or cut from surgery as told. Clean and wash the injured area as told. Gently dry your foot and leg before putting the boot back on. How to take off your walking boot Take off the walking boot as told by your provider. In most cases, it's OK to take off the boot: When you're resting or sleeping. To clean your foot  and leg. How to keep your walking boot clean Do not put any part of the boot in a washing machine or dryer. Do not use chemical cleaning products. These may irritate your skin. Do not soak the liner of the boot. Use a washcloth with mild soap and water to clean the frame and liner of the boot by hand. Let the boot dry fully before you put it back on your foot. Follow these instructions at home: Activity Ask what things are safe for you to do. Bathe and shower as told by your provider. Do not do things that could make your injury worse. Ask when it's safe to drive. Contact a health care provider if: The boot is cracked or damaged. The boot doesn't fit right. Your foot or leg hurts. You have a rash, sore, or open sore on your foot or leg. The skin on your foot or leg is pale. You have a wound or cut on your foot that's  getting worse. Your skin is painful, red, or irritated. Your swelling doesn't get better, or it gets worse. Get help right away if: Your foot or leg turns numb. The skin on your foot or leg is: Cold. Blue. Wallace Cullens. This information is not intended to replace advice given to you by your health care provider. Make sure you discuss any questions you have with your health care provider. Document Revised: 04/07/2023 Document Reviewed: 04/07/2023 Elsevier Patient Education  2024 [employer].

## 2024-01-27 ENCOUNTER — Other Ambulatory Visit (HOSPITAL_BASED_OUTPATIENT_CLINIC_OR_DEPARTMENT_OTHER): Payer: Self-pay

## 2024-01-28 NOTE — Progress Notes (Signed)
  Subjective:  Patient ID: Joe Huffman, male    DOB: 11-11-1965,  MRN: [id number]  Chief Complaint  Patient presents with   Foot Pain    RM#11 Patient states not sure when foot pain actually started but has a history of plantar fasciitis uses insoles and pain in left foot has worsening at this time.    Discussed the use of AI scribe software for clinical note transcription with the patient, who gave verbal consent to proceed.  History of Present Illness The patient, with a history of plantar fasciitis, presents with persistent left foot pain. He describes the pain as constant, located on the top of the foot, especially in the area between the third and fourth metatarsals. The pain started suddenly in late January or early February, after changing his shoes and resuming his regular gym routine. The pain did not improve with over-the-counter pain medication or topical analgesics. The patient denies any recent trauma or injury to the foot. He has been wearing Merrell shoes with insoles, which previously helped manage his plantar fasciitis. The patient's activities have been limited due to the pain, and he has been limping.    Objective:    Physical Exam General: AAO x3, NAD  Dermatological: Skin is warm, dry and supple bilateral. There are no open sores, no preulcerative lesions, no rash or signs of infection present.  Vascular: Dorsalis Pedis artery and Posterior Tibial artery pedal pulses are 2/4 bilateral with immedate capillary fill time. There is no pain with calf compression, swelling, warmth, erythema.   Neruologic: Grossly intact via light touch bilateral.   Musculoskeletal: There is tenderness palpation to the left foot particular along the third, fourth metatarsal bases, midfoot.  There is some slight edema with is no erythema.  Flexor muscles or tendons.  Intact.  There is no erythema or warmth.  MMT 5/5.    No images are attached to the encounter.     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  Calcaneal spurring is present.   Assessment:   1. Capsulitis of left foot   2. Stress fracture, left foot, initial encounter for fracture      Plan:  Patient was evaluated and treated and all questions answered.  Assessment and Plan Assessment & Plan Left foot pain with suspected possible stress fracture - Prescribed meloxicam for inflammation. - Advised icing the affected area. - Provided boot for immobilization.  Can be dispensed to help facilitate healing. - Consider MRI if no improvement.     Return in about 3 weeks (around 02/16/2024) for foot pain.   Charity Conch DPM

## 2024-01-29 ENCOUNTER — Ambulatory Visit: Admitting: Podiatry

## 2024-02-11 ENCOUNTER — Other Ambulatory Visit (HOSPITAL_BASED_OUTPATIENT_CLINIC_OR_DEPARTMENT_OTHER): Payer: Self-pay

## 2024-02-12 ENCOUNTER — Other Ambulatory Visit (HOSPITAL_BASED_OUTPATIENT_CLINIC_OR_DEPARTMENT_OTHER): Payer: Self-pay

## 2024-02-12 MED ORDER — ROSUVASTATIN CALCIUM 10 MG PO TABS
10.0000 mg | ORAL_TABLET | Freq: Every day | ORAL | 3 refills | Status: AC
  Filled 2024-02-12: qty 90, 90d supply, fill #0
  Filled 2024-05-13: qty 90, 90d supply, fill #1

## 2024-02-16 ENCOUNTER — Ambulatory Visit (INDEPENDENT_AMBULATORY_CARE_PROVIDER_SITE_OTHER): Admitting: Podiatry

## 2024-02-16 DIAGNOSIS — M778 Other enthesopathies, not elsewhere classified: Secondary | ICD-10-CM

## 2024-02-19 NOTE — Progress Notes (Signed)
  Subjective:  Patient ID: Joe Huffman, male    DOB: 1966/03/16,  MRN: [id number]  Chief Complaint  Patient presents with   Foot Pain    RM#12 Follow up on left foot pain patient states his foot is 80 percent better.    History of Present Illness 58 year old male presents the office today with above concerns.  He states that he is doing much better his left foot.  He has not been wearing the walking boot.  He is not having significant pain in the pain is about 80% improved.  No increase in swelling.  No recent injuries or changes otherwise.     Objective:    Physical Exam General: AAO x3, NAD-presents wearing regular shoe  Dermatological: Skin is warm, dry and supple bilateral. There are no open sores, no preulcerative lesions, no rash or signs of infection present.  Vascular: Dorsalis Pedis artery and Posterior Tibial artery pedal pulses are 2/4 bilateral with immedate capillary fill time. There is no pain with calf compression, swelling, warmth, erythema.   Neruologic: Grossly intact via light touch bilateral.   Musculoskeletal: I am unable to appreciate significant tenderness to the left foot.  He does get tenderness along the third, fourth metatarsal bases but there is no area pinpoint tenderness otherwise.  His symptoms have improved and is not as tender.  There is no edema, erythema.  Flexor, extensor tendons intact to 5/5.  No images are attached to the encounter.    Results  Assessment:   1. Capsulitis of left foot   2. Stress fracture, left foot, initial encounter for fracture      Plan:  Patient was evaluated and treated and all questions answered.  Assessment and Plan Assessment & Plan Left foot pain with suspected possible stress fracture - Overall symptoms have improved.  Continue with supportive shoe gear and stiffer soled shoe to help immobilize the foot.  Discussed Voltaren gel as he was already on oral anti-inflammatories.  If symptoms persist  repeat x-rays and if negative consider steroid injection.  Charity Conch DPM

## 2024-02-20 ENCOUNTER — Other Ambulatory Visit (HOSPITAL_BASED_OUTPATIENT_CLINIC_OR_DEPARTMENT_OTHER): Payer: Self-pay

## 2024-02-20 MED ORDER — NEBIVOLOL HCL 10 MG PO TABS
10.0000 mg | ORAL_TABLET | Freq: Every day | ORAL | 3 refills | Status: AC
  Filled 2024-02-20: qty 90, 90d supply, fill #0
  Filled 2024-05-31: qty 90, 90d supply, fill #1
  Filled 2024-05-31: qty 30, 30d supply, fill #1

## 2024-02-23 ENCOUNTER — Other Ambulatory Visit (HOSPITAL_BASED_OUTPATIENT_CLINIC_OR_DEPARTMENT_OTHER): Payer: Self-pay

## 2024-02-23 ENCOUNTER — Other Ambulatory Visit: Payer: Self-pay | Admitting: Family Medicine

## 2024-02-23 DIAGNOSIS — R911 Solitary pulmonary nodule: Secondary | ICD-10-CM

## 2024-02-23 MED ORDER — METFORMIN HCL ER 500 MG PO TB24
500.0000 mg | ORAL_TABLET | Freq: Two times a day (BID) | ORAL | 1 refills | Status: AC
  Filled 2024-02-23: qty 180, 90d supply, fill #0

## 2024-02-28 ENCOUNTER — Ambulatory Visit
Admission: RE | Admit: 2024-02-28 | Discharge: 2024-02-28 | Disposition: A | Discharge status: Home / Self Care | Source: Ambulatory Visit | Attending: Family Medicine | Admitting: Family Medicine

## 2024-02-28 DIAGNOSIS — R911 Solitary pulmonary nodule: Secondary | ICD-10-CM

## 2024-04-10 ENCOUNTER — Other Ambulatory Visit (HOSPITAL_BASED_OUTPATIENT_CLINIC_OR_DEPARTMENT_OTHER): Payer: Self-pay

## 2024-04-11 ENCOUNTER — Other Ambulatory Visit (HOSPITAL_BASED_OUTPATIENT_CLINIC_OR_DEPARTMENT_OTHER): Payer: Self-pay

## 2024-04-11 MED ORDER — VITAMIN D3 1.25 MG (50000 UT) PO CAPS
50000.0000 [IU] | ORAL_CAPSULE | ORAL | 0 refills | Status: AC
  Filled 2024-05-13: qty 12, 84d supply, fill #0

## 2024-04-29 ENCOUNTER — Other Ambulatory Visit (HOSPITAL_BASED_OUTPATIENT_CLINIC_OR_DEPARTMENT_OTHER): Payer: Self-pay

## 2024-04-29 MED ORDER — CAPVAXIVE 0.5 ML IM SOSY
0.5000 mL | PREFILLED_SYRINGE | Freq: Once | INTRAMUSCULAR | 0 refills | Status: AC
  Filled 2024-04-29: qty 0.5, 1d supply, fill #0

## 2024-05-13 ENCOUNTER — Other Ambulatory Visit (HOSPITAL_BASED_OUTPATIENT_CLINIC_OR_DEPARTMENT_OTHER): Payer: Self-pay

## 2024-05-16 ENCOUNTER — Other Ambulatory Visit (HOSPITAL_BASED_OUTPATIENT_CLINIC_OR_DEPARTMENT_OTHER): Payer: Self-pay

## 2024-05-31 ENCOUNTER — Other Ambulatory Visit (HOSPITAL_BASED_OUTPATIENT_CLINIC_OR_DEPARTMENT_OTHER): Payer: Self-pay
